# Patient Record
Sex: Female | Born: 1995 | Race: Black or African American | Hispanic: No | Marital: Single | Smoking: Never smoker
Health system: Southern US, Community
[De-identification: ages and names within clinical notes are randomized; demographics above are authoritative.]

## PROBLEM LIST (undated history)

## (undated) ENCOUNTER — Ambulatory Visit (HOSPITAL_COMMUNITY): Discharge status: Absent without leave | Payer: Self-pay

## (undated) DIAGNOSIS — E669 Obesity, unspecified: Secondary | ICD-10-CM

## (undated) DIAGNOSIS — N390 Urinary tract infection, site not specified: Secondary | ICD-10-CM

## (undated) DIAGNOSIS — B009 Herpesviral infection, unspecified: Secondary | ICD-10-CM

## (undated) HISTORY — DX: Obesity, unspecified: E66.9

## (undated) HISTORY — PX: NO PAST SURGERIES: SHX2092

## (undated) HISTORY — DX: Urinary tract infection, site not specified: N39.0

---
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
## 2016-01-16 ENCOUNTER — Encounter: Payer: Self-pay | Admitting: Emergency Medicine

## 2016-01-16 ENCOUNTER — Emergency Department
Admission: EM | Admit: 2016-01-16 | Discharge: 2016-01-16 | Disposition: A | Discharge status: Home / Self Care | Payer: Self-pay | Attending: Emergency Medicine | Admitting: Emergency Medicine

## 2016-01-16 DIAGNOSIS — N390 Urinary tract infection, site not specified: Secondary | ICD-10-CM | POA: Insufficient documentation

## 2016-01-16 DIAGNOSIS — N9089 Other specified noninflammatory disorders of vulva and perineum: Secondary | ICD-10-CM | POA: Insufficient documentation

## 2016-01-16 LAB — URINALYSIS COMPLETE WITH MICROSCOPIC (ARMC ONLY)
Bacteria, UA: NONE SEEN
Bilirubin Urine: NEGATIVE
Glucose, UA: NEGATIVE mg/dL
Hgb urine dipstick: NEGATIVE
Nitrite: NEGATIVE
PROTEIN: 30 mg/dL — AB
SPECIFIC GRAVITY, URINE: 1.024 (ref 1.005–1.030)
pH: 6 (ref 5.0–8.0)

## 2016-01-16 LAB — WET PREP, GENITAL
Clue Cells Wet Prep HPF POC: NONE SEEN
Sperm: NONE SEEN
TRICH WET PREP: NONE SEEN
YEAST WET PREP: NONE SEEN

## 2016-01-16 LAB — POCT PREGNANCY, URINE: Preg Test, Ur: NEGATIVE

## 2016-01-16 MED ORDER — SULFAMETHOXAZOLE-TRIMETHOPRIM 800-160 MG PO TABS: 1.0000 | ORAL_TABLET | Freq: Two times a day (BID) | ORAL | 0 refills | Status: AC

## 2016-01-16 NOTE — ED Notes (Signed)
Assessment per provider 

## 2016-01-16 NOTE — ED Provider Notes (Signed)
[facility] Emergency Department Provider Note  ____________________________________________  Time seen: Approximately 7:21 PM  I have reviewed the triage vital signs and the nursing notes.   HISTORY  Chief Complaint Other (vaginal pain/bumps)   HPI Margaret Sosa is a 20 y.o. female is here with complaint of vaginal discharge along with "bumps". Patient states she was seen by the doctor at the apartment who told her she had a urinary tract infection. Patient has had dysuria but noticed yesterday that she had "bumps" that were very painful. Patient denies any prior herpes infections. She states that currently she is sexually active and did have a STD workup at the [facility] Department which she was told was negative.She also complains of a vaginal discharge and she is also spotting with her menses at this time. Denies any fever or chills, there is been no nausea or vomiting, and no abdominal pain. Currently she rates her pain as 6/10.   History reviewed. No pertinent past medical history.  There are no active problems to display for this patient.   History reviewed. No pertinent surgical history.    Allergies Review of patient's allergies indicates no known allergies.  History reviewed. No pertinent family history.  Social History Social History  Substance Use Topics  . Smoking status: Never Smoker  . Smokeless tobacco: Never Used  . Alcohol use Not on file    Review of Systems Constitutional: No fever/chills Cardiovascular: Denies chest pain. Respiratory: Denies shortness of breath. Gastrointestinal: No abdominal pain.  No nausea, no vomiting.  Genitourinary: Positive for dysuria. Positive for discharge. Positive for painful lesions labia. Musculoskeletal: Negative for back pain. Skin: Positive for lesions. Neurological: Negative for headaches.  10-point ROS otherwise negative.  ____________________________________________   PHYSICAL  EXAM:  VITAL SIGNS: ED Triage Vitals  Enc Vitals Group     BP 01/16/16 1821 (!) 151/95     Pulse Rate 01/16/16 1821 98     Resp 01/16/16 1821 18     Temp 01/16/16 1821 98.5 F (36.9 C)     Temp src --      SpO2 01/16/16 1821 98 %     Weight 01/16/16 1821 209 lb (94.8 kg)     Height 01/16/16 1821  (1.626 m)     Head Circumference --      Peak Flow --      Pain Score 01/16/16 1822 6     Pain Loc --      Pain Edu? --      Excl. in GC? --     Constitutional: Alert and oriented. Well appearing and in no acute distress. Eyes: Conjunctivae are normal. PERRL. EOMI. Head: Atraumatic. Nose: No congestion/rhinnorhea. Neck: No stridor.   Cardiovascular: Normal rate, regular rhythm. Grossly normal heart sounds.  Good peripheral circulation. Respiratory: Normal respiratory effort.  No retractions. Lungs CTAB. Gastrointestinal: Soft and nontender. No distention.  Genitourinary: On examination of the labia there are multiple papules all of which have been uncapped and there is no vesicles present. There is little to no erythema present at the base. Areas are extremely tender to touch. Vaginal wall does not appear to be involved. There is some brownish colored discharge present. No adnexal masses or tenderness was noted. Negative cervical motion tenderness. Musculoskeletal: Moves upper and lower extremities without any difficulty. Normal gait was noted. Neurologic:  Normal speech and language. No gross focal neurologic deficits are appreciated. No gait instability. Skin:  Skin is warm, dry and intact.  Rash as noted above and genitourinary. Psychiatric: Mood and affect are normal. Speech and behavior are normal.  ____________________________________________   LABS (all labs ordered are listed, but only abnormal results are displayed)  Labs Reviewed  WET PREP, GENITAL - Abnormal; Notable for the following:       Result Value   WBC, Wet Prep HPF POC TOO NUMEROUS TO COUNT (*)    All  other components within normal limits  URINALYSIS COMPLETEWITH MICROSCOPIC ([facility] ONLY) - Abnormal; Notable for the following:    Color, Urine YELLOW (*)    APPearance CLEAR (*)    Ketones, ur 1+ (*)    Protein, ur 30 (*)    Leukocytes, UA 1+ (*)    Squamous Epithelial / LPF 0-5 (*)    All other components within normal limits  HSV CULTURE AND TYPING  POC URINE PREG, ED  POCT PREGNANCY, URINE    PROCEDURES  Procedure(s) performed: None  Procedures  Critical Care performed: No  ____________________________________________   INITIAL IMPRESSION / ASSESSMENT AND PLAN / ED COURSE  Pertinent labs & imaging results that were available during my care of the patient were reviewed by me and considered in my medical decision making (see chart for details).  Patient was able to show bilateral where she was only given Bactrim DS twice a day for 3 days for urinary tract infection from the health Department. Also wet prep and urine continued dissection showed WBCs. A viral culture was sent to the lab and patient is aware this probably take 72 hours for the results. At this time because the atypical presentation no anti- viral medication was started. ____________________________________________   FINAL CLINICAL IMPRESSION(S) / ED DIAGNOSES  Final diagnoses:  Acute urinary tract infection  Labial lesion      NEW MEDICATIONS STARTED DURING THIS VISIT:  New Prescriptions   SULFAMETHOXAZOLE-TRIMETHOPRIM (BACTRIM DS,SEPTRA DS) 800-160 MG TABLET    Take 1 tablet by mouth 2 (two) times daily.     Note:  This document was prepared using Dragon voice recognition software and may include unintentional dictation errors.    Tommi Rumps, PA-C 01/16/16 2202    Myrna Blazer, MD 01/17/16 [phone]

## 2016-01-16 NOTE — ED Triage Notes (Signed)
2 days ago started with dysuria. Bought strip at [facility] and said had UTI. Went to doctor and was given abx but the doctor did not evaluate her vaginal pain or "bumps down there". Patient reports only here for the vaginal pain/bumps. Has had vaginal discharge.

## 2016-01-16 NOTE — ED Notes (Signed)
HSV specimen collected by PA and sent by ED tech Allayne Stack.

## 2016-01-20 LAB — HSV CULTURE AND TYPING

## 2016-12-21 ENCOUNTER — Encounter: Payer: Self-pay | Admitting: Obstetrics and Gynecology

## 2016-12-21 ENCOUNTER — Ambulatory Visit (INDEPENDENT_AMBULATORY_CARE_PROVIDER_SITE_OTHER): Payer: Medicaid Other | Admitting: Obstetrics and Gynecology

## 2016-12-21 VITALS — BP 117/71 | HR 73 | Ht 64.0 in | Wt 214.4 lb

## 2016-12-21 DIAGNOSIS — E669 Obesity, unspecified: Secondary | ICD-10-CM

## 2016-12-21 DIAGNOSIS — Z833 Family history of diabetes mellitus: Secondary | ICD-10-CM

## 2016-12-21 DIAGNOSIS — B373 Candidiasis of vulva and vagina: Secondary | ICD-10-CM | POA: Diagnosis not present

## 2016-12-21 DIAGNOSIS — Z87440 Personal history of urinary (tract) infections: Secondary | ICD-10-CM

## 2016-12-21 DIAGNOSIS — B3731 Acute candidiasis of vulva and vagina: Secondary | ICD-10-CM

## 2016-12-21 HISTORY — DX: Obesity, unspecified: E66.9

## 2016-12-21 LAB — POCT URINALYSIS DIPSTICK
Bilirubin, UA: NEGATIVE
GLUCOSE UA: NEGATIVE
Ketones, UA: NEGATIVE
Leukocytes, UA: NEGATIVE
NITRITE UA: NEGATIVE
Protein, UA: NEGATIVE
RBC UA: NEGATIVE
Spec Grav, UA: 1.01 (ref 1.010–1.025)
UROBILINOGEN UA: 0.2 U/dL
pH, UA: 6.5 (ref 5.0–8.0)

## 2016-12-21 MED ORDER — NITROFURANTOIN MONOHYD MACRO 100 MG PO CAPS: 100.0000 mg | ORAL_CAPSULE | Freq: Every day | ORAL | 6 refills | Status: DC

## 2016-12-21 MED ORDER — FLUCONAZOLE 150 MG PO TABS: 150.0000 mg | ORAL_TABLET | ORAL | 0 refills | Status: DC

## 2016-12-21 NOTE — Progress Notes (Signed)
GYNECOLOGY CLINIC PROGRESS NOTE  Subjective:    Margaret Sosa is a 21 y.o. G0P0 female who complains of recurrent UTI's.   Notes that she has had 7 UTI's in the past year, 3 within the past 2 months. Not associated with timing of intercourse, and has good hygiene practices (uses vaginal wipes, changes clothes if excessive sweating, uses mild soaps).  Also notes recurrent yeast infections.  Notes that these tend to alternate with the UTI's. Is currently treating for a yeast infection, has been on medication x 3 days with Monistat.  Patient denies back pain and fever. Patient does not have a history of pyelonephritis.   OB History  Gravida Para Term Preterm AB Living  0 0 0 0 0 0  SAB TAB Ectopic Multiple Live Births  0 0 0 0 0         Past Medical History:  Diagnosis Date  . Obesity (BMI 35.0-39.9 without comorbidity) 12/21/2016  . Recurrent UTI (urinary tract infection)     Past Surgical History:  Procedure Laterality Date  . NO PAST SURGERIES      Family History  Problem Relation Age of Onset  . Hypertension Mother   . Diabetes Sister 4    Social History   Social History  . Marital status: Single    Spouse name: N/A  . Number of children: N/A  . Years of education: N/A   Occupational History  . Not on file.   Social History Main Topics  . Smoking status: Never Smoker  . Smokeless tobacco: Never Used  . Alcohol use No  . Drug use: Yes    Types: Marijuana  . Sexual activity: Yes    Birth control/ protection: Implant   Other Topics Concern  . Not on file   Social History Narrative  . No narrative on file    No current outpatient prescriptions on file prior to visit.   No current facility-administered medications on file prior to visit.     No Known Allergies    Review of Systems Pertinent items noted in HPI and remainder of comprehensive ROS otherwise negative.   Objective:    BP 117/71 (BP Location: Left Arm, Patient Position: Sitting,  Cuff Size: Large)   Pulse 73   Ht 5\' 4"  (1.626 m)   Wt 214 lb 6.4 oz (97.3 kg)   LMP 01/25/2016   BMI 36.80 kg/m  General: alert and no distress  Abdomen: soft, non-tender, without masses or organomegaly  Back: spine nontender, straight, CVA tenderness absent  GU: external genitalia normal, rectovaginal septum normal. Urethral meatus normal, no lesions.  Vagina with small amount of white clumpy discharge and white cream.  Cervix normal appearing, no lesions and no motion tenderness.  Uterus mobile, nontender, normal shape and size.  Adnexae non-palpable, nontender bilaterally.   Extremities: Non-tender,  no cyanosis or edema  Neurologic: Grossly intact    Laboratory:  Results for orders placed or performed in visit on 12/21/16  POCT urinalysis dipstick  Result Value Ref Range   Color, UA pale yellow    Clarity, UA clear    Glucose, UA neg    Bilirubin, UA neg    Ketones, UA neg    Spec Grav, UA 1.010 1.010 - 1.025   Blood, UA neg    pH, UA 6.5 5.0 - 8.0   Protein, UA neg    Urobilinogen, UA 0.2 0.2 or 1.0 E.U./dL   Nitrite, UA neg    Leukocytes, UA  Negative Negative     Assessment:   Recurrent vulvovaginitis  Recurrent UTI's  Family history of diabetes (childhood onset in sister) Obesity  Plan:    1. Medications: nitrofurantoin and Diflucan for supression therapy.  2. Maintain adequate hydration.  Can use cranberry supplements/juice.   3. Reiterated good hygiene techniques, including wiping/showering and voiding prior to and after intercourse, avoiding sodas/sugary foods.  4. Patient with family history of early-onset diabetes in sister.  Recommend screening for diabetes as patient is susceptible to multiple infections.  5. Discussion on weight management in patient, healthy lifestyle choices as patient is at risk for medical comorbidities at an earlier age with obesity.  6. Follow up if symptoms not improving, and in 6 months.     Hildred Laser, MD [facility]

## 2016-12-22 LAB — HEMOGLOBIN A1C
ESTIMATED AVERAGE GLUCOSE: 123 mg/dL
Hgb A1c MFr Bld: 5.9 % — ABNORMAL HIGH (ref 4.8–5.6)

## 2016-12-23 LAB — URINE CULTURE

## 2016-12-26 ENCOUNTER — Telehealth: Payer: Self-pay

## 2016-12-26 DIAGNOSIS — R7303 Prediabetes: Secondary | ICD-10-CM

## 2016-12-26 NOTE — Telephone Encounter (Signed)
Called pt informed her of the information below. Referral sent for [facility] at pt's request. Scheduled for lab visit in 6mos.

## 2016-12-26 NOTE — Telephone Encounter (Signed)
-----   Message from Hildred LaserAnika Cherry, MD sent at 12/26/2016 12:09 PM EDT ----- Sorry, I got her confused with my other patient (21 y.o. Pregnant) when I saw her picture in the demographics.  But she does not require a PCP at this time, she does however need to be aware that she is pre-diabetic.  She would benefit from some Lifestyles intervention (dietary modification and exercise), and can be referred to Lifestyles classes if desired.  She should probably have her levels checked again in 3-6 months after initiation of modifications.

## 2017-01-26 ENCOUNTER — Emergency Department: Payer: Medicaid Other

## 2017-01-26 ENCOUNTER — Encounter: Payer: Self-pay | Admitting: Emergency Medicine

## 2017-01-26 ENCOUNTER — Emergency Department
Admission: EM | Admit: 2017-01-26 | Discharge: 2017-01-26 | Disposition: A | Discharge status: Home / Self Care | Payer: Medicaid Other | Attending: Emergency Medicine | Admitting: Emergency Medicine

## 2017-01-26 DIAGNOSIS — Y92410 Unspecified street and highway as the place of occurrence of the external cause: Secondary | ICD-10-CM | POA: Diagnosis not present

## 2017-01-26 DIAGNOSIS — M79632 Pain in left forearm: Secondary | ICD-10-CM | POA: Insufficient documentation

## 2017-01-26 DIAGNOSIS — Z79899 Other long term (current) drug therapy: Secondary | ICD-10-CM | POA: Insufficient documentation

## 2017-01-26 DIAGNOSIS — Y939 Activity, unspecified: Secondary | ICD-10-CM | POA: Insufficient documentation

## 2017-01-26 DIAGNOSIS — Y999 Unspecified external cause status: Secondary | ICD-10-CM | POA: Diagnosis not present

## 2017-01-26 DIAGNOSIS — S92314A Nondisplaced fracture of first metatarsal bone, right foot, initial encounter for closed fracture: Secondary | ICD-10-CM | POA: Diagnosis not present

## 2017-01-26 DIAGNOSIS — S90921A Unspecified superficial injury of right foot, initial encounter: Secondary | ICD-10-CM | POA: Diagnosis present

## 2017-01-26 MED ORDER — HYDROCODONE-ACETAMINOPHEN 5-325 MG PO TABS
2.0000 | ORAL_TABLET | Freq: Once | ORAL | Status: AC
  Administered 2017-01-26: 2 via ORAL
  Filled 2017-01-26: qty 2

## 2017-01-26 MED ORDER — HYDROCODONE-ACETAMINOPHEN 5-325 MG PO TABS: 1.0000 | ORAL_TABLET | ORAL | 0 refills | Status: DC | PRN
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
## 2017-06-27 ENCOUNTER — Encounter: Payer: Medicaid Other | Admitting: Obstetrics and Gynecology

## 2021-04-28 ENCOUNTER — Other Ambulatory Visit: Payer: Self-pay

## 2021-04-28 ENCOUNTER — Encounter (HOSPITAL_COMMUNITY): Payer: Self-pay | Admitting: Obstetrics & Gynecology

## 2021-04-28 ENCOUNTER — Inpatient Hospital Stay (HOSPITAL_COMMUNITY)
Admission: AD | Admit: 2021-04-28 | Discharge: 2021-04-28 | Disposition: A | Discharge status: Home / Self Care | Payer: BC Managed Care – PPO | Attending: Obstetrics and Gynecology | Admitting: Obstetrics and Gynecology

## 2021-04-28 ENCOUNTER — Ambulatory Visit (HOSPITAL_COMMUNITY)
Admission: EM | Admit: 2021-04-28 | Discharge: 2021-04-28 | Disposition: A | Discharge status: Home / Self Care | Payer: BC Managed Care – PPO | Attending: Internal Medicine | Admitting: Internal Medicine

## 2021-04-28 DIAGNOSIS — Z3201 Encounter for pregnancy test, result positive: Secondary | ICD-10-CM

## 2021-04-28 DIAGNOSIS — O209 Hemorrhage in early pregnancy, unspecified: Secondary | ICD-10-CM | POA: Diagnosis present

## 2021-04-28 DIAGNOSIS — O200 Threatened abortion: Secondary | ICD-10-CM | POA: Diagnosis not present

## 2021-04-28 DIAGNOSIS — Z3A09 9 weeks gestation of pregnancy: Secondary | ICD-10-CM | POA: Insufficient documentation

## 2021-04-28 LAB — POC URINE PREG, ED: Preg Test, Ur: POSITIVE — AB

## 2021-04-28 LAB — HCG, QUANTITATIVE, PREGNANCY: hCG, Beta Chain, Quant, S: 5063 m[IU]/mL — ABNORMAL HIGH (ref <5)

## 2021-04-28 NOTE — MAU Note (Signed)
Pt stated she had some pink spotting and cramping on Sunday. Went OBGYN  and they did U/S told her was 5 weeks.  Has had more spotting and cramping yesterday and today.  Passed a few small blood clots.

## 2021-04-28 NOTE — MAU Provider Note (Signed)
History     Chief Complaint  Patient presents with   Abdominal Pain   Vaginal Bleeding   25 yo G1P0 SBF presents with c/o vaginal bleeding and cramping with passage of a clot. Pt had sonogram  2 days ago where a gestational sac was seen but not measurable w/r to gestational age. Pt notes she had been  Having vaginal bleeding from last sunday  OB History     Gravida  1   Para  0   Term  0   Preterm  0   AB  0   Living  0      SAB  0   IAB  0   Ectopic  0   Multiple  0   Live Births  0           Past Medical History:  Diagnosis Date   Obesity (BMI 35.0-39.9 without comorbidity) 12/21/2016   Recurrent UTI (urinary tract infection)     Past Surgical History:  Procedure Laterality Date   NO PAST SURGERIES      Family History  Problem Relation Age of Onset   Hypertension Mother    Diabetes Sister 4    Social History   Tobacco Use   Smoking status: Never   Smokeless tobacco: Never  Vaping Use   Vaping Use: Never used  Substance Use Topics   Alcohol use: Not Currently   Drug use: Not Currently    Types: Marijuana    Comment: not since pregnancy    Allergies: No Known Allergies  Medications Prior to Admission  Medication Sig Dispense Refill Last Dose   etonogestrel (NEXPLANON) 68 MG IMPL implant 1 each by Subdermal route once.      fluconazole (DIFLUCAN) 150 MG tablet Take 1 tablet (150 mg total) by mouth once a week. For 6 months 24 tablet 0    HYDROcodone-acetaminophen (NORCO/VICODIN) 5-325 MG tablet Take 1 tablet by mouth every 4 (four) hours as needed. 15 tablet 0    nitrofurantoin, macrocrystal-monohydrate, (MACROBID) 100 MG capsule Take 1 capsule (100 mg total) by mouth daily. 30 capsule 6      Physical Exam   Blood pressure 130/69, pulse 91, temperature 98.7 F (37.1 C), resp. rate 18, height 5\' 4"  (1.626 m), weight 101.2 kg, last menstrual period 03/01/2021.  General appearance: alert, cooperative, and no distress Lungs: clear  to auscultation bilaterally Heart: regular rate and rhythm, S1, S2 normal, no murmur, click, rub or gallop Abdomen:  soft obese nontender Pelvic: external genitalia normal and vagina scant blood in the vault. Cervix closed. Uterus sl enlarged non tender Adnexa no palp mass  IMP: threatened abortion P) hquant ED Course   MDM Addendum11/11/2020 Marland Kitchen Reviewed lab with pt  Threatened AB precautions reviewed  Keep scheduled f/u sonogram appt for viability D/c home  CHYIFO [id number], MD 12:03 PM 04/28/2021

## 2021-04-30 ENCOUNTER — Emergency Department (HOSPITAL_COMMUNITY)
Admission: EM | Admit: 2021-04-30 | Discharge: 2021-05-01 | Disposition: A | Discharge status: Home / Self Care | Payer: BC Managed Care – PPO | Attending: Emergency Medicine | Admitting: Emergency Medicine

## 2021-04-30 ENCOUNTER — Other Ambulatory Visit: Payer: Self-pay

## 2021-04-30 ENCOUNTER — Encounter (HOSPITAL_COMMUNITY): Payer: Self-pay | Admitting: Emergency Medicine

## 2021-04-30 ENCOUNTER — Emergency Department (HOSPITAL_COMMUNITY): Payer: BC Managed Care – PPO

## 2021-04-30 DIAGNOSIS — Z3A00 Weeks of gestation of pregnancy not specified: Secondary | ICD-10-CM | POA: Diagnosis not present

## 2021-04-30 DIAGNOSIS — N939 Abnormal uterine and vaginal bleeding, unspecified: Secondary | ICD-10-CM

## 2021-04-30 DIAGNOSIS — R102 Pelvic and perineal pain: Secondary | ICD-10-CM | POA: Insufficient documentation

## 2021-04-30 DIAGNOSIS — O209 Hemorrhage in early pregnancy, unspecified: Secondary | ICD-10-CM | POA: Diagnosis present

## 2021-04-30 HISTORY — DX: Herpesviral infection, unspecified: B00.9

## 2021-04-30 LAB — BASIC METABOLIC PANEL
Anion gap: 6 (ref 5–15)
BUN: 9 mg/dL (ref 6–20)
CO2: 24 mmol/L (ref 22–32)
Calcium: 9.3 mg/dL (ref 8.9–10.3)
Chloride: 106 mmol/L (ref 98–111)
Creatinine, Ser: 0.58 mg/dL (ref 0.44–1.00)
GFR, Estimated: >60 mL/min (ref >60)
Glucose, Bld: 100 mg/dL — ABNORMAL HIGH (ref 70–99)
Potassium: 4.1 mmol/L (ref 3.5–5.1)
Sodium: 136 mmol/L (ref 135–145)

## 2021-04-30 LAB — CBC
HCT: 38.2 % (ref 36.0–46.0)
Hemoglobin: 12.2 g/dL (ref 12.0–15.0)
MCH: 27.1 pg (ref 26.0–34.0)
MCHC: 31.9 g/dL (ref 30.0–36.0)
MCV: 84.9 fL (ref 80.0–100.0)
Platelets: 317 10*3/uL (ref 150–400)
RBC: 4.5 MIL/uL (ref 3.87–5.11)
RDW: 14.9 % (ref 11.5–15.5)
WBC: 8 10*3/uL (ref 4.0–10.5)
nRBC: 0 % (ref 0.0–0.2)

## 2021-04-30 LAB — I-STAT BETA HCG BLOOD, ED (MC, WL, AP ONLY): I-stat hCG, quantitative: 1159.9 m[IU]/mL — ABNORMAL HIGH (ref <5)

## 2021-04-30 NOTE — ED Provider Notes (Signed)
[facility]-EMERGENCY DEPT Provider Note   CSN: [id number] Arrival date & time: 04/30/21  2102     History Chief Complaint  Patient presents with   Miscarriage    Margaret Sosa is a 25 y.o. female with a hx of obesity & HSV2 who presents to the ED with complaints of vaginal bleeding x 5 days suspected to be about [redacted] weeks pregnant.  Patient states that she has been having some mild pelvic cramping and spotting since 04/26/2021, she saw her OB/GYN Dr. Cherly Hensen that day who did a pelvic exam and informed her that her cervix was closed at that time, plan was for continued monitoring.  2 days ago she started to have increasing cramping and increase in bleeding, she was again seen by her OB/GYN, hCG quant level looked okay at that time per patient report, plan was for continued monitoring.  Today she states that she passed a large blood clot and since then the cramping has mostly resolved, she still has some mild bleeding.  No specific alleviating or aggravating factors to her symptoms.  She denies fever, chills, vomiting, syncope, chest pain, or trouble breathing.  This is her first pregnancy.    HPI     Past Medical History:  Diagnosis Date   HSV-2 (herpes simplex virus 2) infection    Obesity (BMI 35.0-39.9 without comorbidity) 12/21/2016   Recurrent UTI (urinary tract infection)     Patient Active Problem List   Diagnosis Date Noted   Obesity (BMI 35.0-39.9 without comorbidity) 12/21/2016    Past Surgical History:  Procedure Laterality Date   NO PAST SURGERIES       OB History     Gravida  1   Para  0   Term  0   Preterm  0   AB  0   Living  0      SAB  0   IAB  0   Ectopic  0   Multiple  0   Live Births  0           Family History  Problem Relation Age of Onset   Hypertension Mother    Diabetes Sister 4    Social History   Tobacco Use   Smoking status: Never   Smokeless tobacco: Never  Vaping Use   Vaping Use: Never  used  Substance Use Topics   Alcohol use: Not Currently   Drug use: Not Currently    Types: Marijuana    Comment: not since pregnancy    Home Medications Prior to Admission medications   Not on File    Allergies    Patient has no known allergies.  Review of Systems   Review of Systems  Constitutional:  Negative for fever.  Respiratory:  Negative for shortness of breath.   Cardiovascular:  Negative for chest pain.  Gastrointestinal:  Negative for diarrhea and vomiting.  Genitourinary:  Positive for pelvic pain and vaginal bleeding. Negative for dysuria.  Neurological:  Negative for syncope.  All other systems reviewed and are negative.  Physical Exam Updated Vital Signs BP (!) 154/86 (BP Location: Right Arm)   Pulse 84   Temp 98.6 F (37 C) (Oral)   Resp 16   Ht 5\' 4"  (1.626 m)   Wt 101.2 kg   LMP 03/01/2021   SpO2 100%   BMI 38.28 kg/m   Physical Exam Vitals and nursing note reviewed.  Constitutional:      General: She is not in  acute distress.    Appearance: She is well-developed. She is not toxic-appearing.  HENT:     Head: Normocephalic and atraumatic.  Eyes:     General:        Right eye: No discharge.        Left eye: No discharge.     Conjunctiva/sclera: Conjunctivae normal.  Cardiovascular:     Rate and Rhythm: Normal rate and regular rhythm.  Pulmonary:     Effort: Pulmonary effort is normal. No respiratory distress.     Breath sounds: Normal breath sounds. No wheezing, rhonchi or rales.  Abdominal:     General: There is no distension.     Palpations: Abdomen is soft.     Tenderness: There is no abdominal tenderness. There is no guarding or rebound.  Genitourinary:    Comments: RN Shanda Bumps present as chaperone.  Mild amount of blood present in the vaginal canal, there is no significant hemorrhaging or significant clots noted. Musculoskeletal:     Cervical back: Neck supple.  Skin:    General: Skin is warm and dry.     Findings: No rash.   Neurological:     Mental Status: She is alert.     Comments: Clear speech.   Psychiatric:        Behavior: Behavior normal.    ED Results / Procedures / Treatments   Labs (all labs ordered are listed, but only abnormal results are displayed) Labs Reviewed  BASIC METABOLIC PANEL - Abnormal; Notable for the following components:      Result Value   Glucose, Bld 100 (*)    All other components within normal limits  I-STAT BETA HCG BLOOD, ED ([facility], [facility], [facility] ONLY) - Abnormal; Notable for the following components:   I-stat hCG, quantitative 1,159.9 (*)    All other components within normal limits  CBC  TYPE AND SCREEN    EKG None  Radiology US OB Comp < 14 Wks  Result Date: 05/01/2021 CLINICAL DATA:  History of vaginal bleeding with positive pregnancy test EXAM: OBSTETRIC <14 WK [place] AND TRANSVAGINAL OB US TECHNIQUE: Both transabdominal and transvaginal ultrasound examinations were performed for complete evaluation of the gestation as well as the maternal uterus, adnexal regions, and pelvic cul-de-sac. Transvaginal technique was performed to assess early pregnancy. COMPARISON:  None. FINDINGS: Intrauterine gestational sac: Absent Maternal uterus/adnexae: Uterus is well visualized with normal appearing endometrium. No evidence of intrauterine or extrauterine gestation is noted. Ovaries are well visualized. Corpus luteum cyst is noted within the right ovary. No acute abnormality is identified. IMPRESSION: No evidence of intrauterine or extra uterine gestation. Given the decreasing beta HCG level, it is possible that the recent vaginal bleeding representative spontaneous abortion. Clinical correlation is recommended. Electronically Signed   By: Alcide Clever M.D.   On: 05/01/2021 00:40   US OB Transvaginal  Result Date: 05/01/2021 CLINICAL DATA:  History of vaginal bleeding with positive pregnancy test EXAM: OBSTETRIC <14 WK [place] AND TRANSVAGINAL OB US TECHNIQUE: Both transabdominal and transvaginal  ultrasound examinations were performed for complete evaluation of the gestation as well as the maternal uterus, adnexal regions, and pelvic cul-de-sac. Transvaginal technique was performed to assess early pregnancy. COMPARISON:  None. FINDINGS: Intrauterine gestational sac: Absent Maternal uterus/adnexae: Uterus is well visualized with normal appearing endometrium. No evidence of intrauterine or extrauterine gestation is noted. Ovaries are well visualized. Corpus luteum cyst is noted within the right ovary. No acute abnormality is identified. IMPRESSION: No evidence of intrauterine or extra uterine gestation.  Given the decreasing beta HCG level, it is possible that the recent vaginal bleeding representative spontaneous abortion. Clinical correlation is recommended. Electronically Signed   By: Alcide Clever M.D.   On: 05/01/2021 00:40    Procedures Procedures   Medications Ordered in ED Medications - No data to display  ED Course  I have reviewed the triage vital signs and the nursing notes.  Pertinent labs & imaging results that were available during my care of the patient were reviewed by me and considered in my medical decision making (see chart for details).    MDM Rules/Calculators/A&P                           Patient presents to the ED with complaints of vaginal bleeding.. Nontoxic, vitals with elevated blood pressure, low suspicion for hypertensive emergency, normalized with repeat vitals.  On exam patient does have some mild blood present in her vaginal canal, there is no active hemorrhaging or significant clots present.  Difficult visualization of the cervix.  Plan for labs and ultrasound.  Additional history obtained:  Additional history obtained from chart review & nursing note review.  Patient had a quantitative hCG level on 04/28/2021 that was 5063  Lab Tests:  I Ordered, reviewed, and interpreted labs, which included:  CBC: Unremarkable BMP: Unremarkable I-STAT beta-hCG:  1159.9.  Imaging Studies ordered:  I ordered imaging studies which included [place], I independently reviewed, formal radiology impression shows:No evidence of intrauterine or extra uterine gestation. Given the decreasing beta HCG level, it is possible that the recent vaginal bleeding representative spontaneous abortion. Clinical correlation is recommended.   ED Course:  Patient with vaginal bleeding, downtrending hCG, no evidence of intrauterine or extrauterine gestation, therefore suspect spontaneous abortion is most likely.  Afebrile without leukocytosis, overall well-appearing, do not suspect septic abortion at this time.  Patient with a positive blood, no RhoGAM.  Overall appears appropriate for discharge home with close OB follow-up and strict return precautions. I discussed results, treatment plan, need for follow-up, and return precautions with the patient. Provided opportunity for questions, patient confirmed understanding and is in agreement with plan.   Findings and plan of care discussed with supervising physician Dr. Rodena Medin who is in agreement.   Portions of this note were generated with Scientist, clinical (histocompatibility and immunogenetics). Dictation errors may occur despite best attempts at proofreading.   Final Clinical Impression(s) / ED Diagnoses Final diagnoses:  Vaginal bleeding    Rx / DC Orders ED Discharge Orders     None        Cherly Anderson, PA-C 05/01/21 0130    Wynetta Fines, MD 05/01/21 2313

## 2021-04-30 NOTE — ED Provider Notes (Signed)
Emergency Medicine Provider Triage Evaluation Note  Margaret Sosa , a 25 y.o. female  was evaluated in triage.  She is a G1, P0 no history of previous pregnancy or miscarriage.  Pt complains of possible miscarriage.  Patient was approximately [redacted] weeks pregnant.  She saw her OB/GYN to have an ultrasound last Tuesday, but was told that it was too early.  She had been having some cramping and had a pelvic exam done where they did note some bleeding but her cervical os is closed.  Patient states she has been vaginally bleeding ever since then.  She says she passed a moderate amount of blood clot today.  She was having some suprapubic abdominal cramping, but that has since passed and she passed the blood clot.  She had a hCG drawn at her OB/GYN visit.  Review of Systems  Positive: Vaginal bleeding, pelvic pain Negative: Lightheadedness  Physical Exam  BP (!) 154/86 (BP Location: Right Arm)   Pulse 84   Temp 98.6 F (37 C) (Oral)   Resp 16   Ht 5\' 4"  (1.626 m)   Wt 101.2 kg   LMP 03/01/2021   SpO2 100%   BMI 38.28 kg/m  Gen:   Awake, no distress   Resp:  Normal effort  MSK:   Moves extremities without difficulty  Other:  Minimal abdominal tenderness to palpation of suprapubic region.  Medical Decision Making  Medically screening exam initiated at 10:13 PM.  Appropriate orders placed.  Margaret Sosa was informed that the remainder of the evaluation will be completed by another provider, this initial triage assessment does not replace that evaluation, and the importance of remaining in the ED until their evaluation is complete.  Reviewed labs and hCG has decreased about 4000 points since it was drawn 2 days ago.  Patient with continued vaginal bleeding, but pelvic pain has started to improve.  CBC with no anemia or leukocytosis.  Ordered type and screen.  This is patient's first pregnancy   Margaret Sosa 04/30/21 2216    2217, MD 05/01/21 [phone]

## 2021-04-30 NOTE — ED Notes (Signed)
Patient transported to Ultrasound 

## 2021-04-30 NOTE — ED Triage Notes (Signed)
Patient arrives complaining of possible miscarriage. Patient seen for cramping in ER and followed up with OB who stated that if cramping worsening go to ER. Over the last few days, pain has increased. Patient states when she woke up and went to the bathroom, she passed a moderate amount of tissue and a clot.

## 2021-05-01 LAB — ABO/RH: ABO/RH(D): A POS

## 2021-05-01 NOTE — Discharge Instructions (Signed)
You were seen in the emergency department today for vaginal bleeding.  Your ultrasound did not show any intrauterine pregnancy and your hormone levels are decreasing raise concern that you are having a miscarriage.  We would like you to follow-up very closely with your OB on Monday for this.  Return to the emergency department immediately for new or worsening symptoms including but not limited to new or worsening pain, increased bleeding, significant bleeding, dizziness, lightheadedness, passing out, inability to keep fluids down, fever, chills, or any other concerns.

## 2022-04-07 ENCOUNTER — Ambulatory Visit (HOSPITAL_COMMUNITY)
Admission: EM | Admit: 2022-04-07 | Discharge: 2022-04-07 | Disposition: A | Discharge status: Home / Self Care | Payer: BC Managed Care – PPO | Attending: Emergency Medicine | Admitting: Emergency Medicine

## 2022-04-07 ENCOUNTER — Encounter (HOSPITAL_COMMUNITY): Payer: Self-pay | Admitting: Emergency Medicine

## 2022-04-07 ENCOUNTER — Ambulatory Visit: Payer: Self-pay

## 2022-04-07 DIAGNOSIS — Z3201 Encounter for pregnancy test, result positive: Secondary | ICD-10-CM | POA: Diagnosis not present

## 2022-04-07 DIAGNOSIS — Z3491 Encounter for supervision of normal pregnancy, unspecified, first trimester: Secondary | ICD-10-CM | POA: Insufficient documentation

## 2022-04-07 DIAGNOSIS — Z113 Encounter for screening for infections with a predominantly sexual mode of transmission: Secondary | ICD-10-CM | POA: Diagnosis not present

## 2022-04-07 DIAGNOSIS — O23591 Infection of other part of genital tract in pregnancy, first trimester: Secondary | ICD-10-CM | POA: Insufficient documentation

## 2022-04-07 DIAGNOSIS — B3731 Acute candidiasis of vulva and vagina: Secondary | ICD-10-CM | POA: Diagnosis not present

## 2022-04-07 LAB — POCT URINALYSIS DIPSTICK, ED / UC
Bilirubin Urine: NEGATIVE
Glucose, UA: NEGATIVE mg/dL
Hgb urine dipstick: NEGATIVE
Ketones, ur: NEGATIVE mg/dL
Nitrite: NEGATIVE
Protein, ur: NEGATIVE mg/dL
Specific Gravity, Urine: 1.025 (ref 1.005–1.030)
Urobilinogen, UA: 0.2 mg/dL (ref 0.0–1.0)
pH: 6 (ref 5.0–8.0)

## 2022-04-07 LAB — POC URINE PREG, ED: Preg Test, Ur: POSITIVE — AB

## 2022-04-07 MED ORDER — MICONAZOLE NITRATE 2 % VA CREA: 1.0000 | TOPICAL_CREAM | Freq: Every day | VAGINAL | 0 refills | Status: AC

## 2022-04-07 MED ORDER — PRENATAL COMPLETE 14-0.4 MG PO TABS: 1.0000 | ORAL_TABLET | Freq: Every day | ORAL | 0 refills | Status: DC

## 2022-04-07 MED ORDER — MICONAZOLE NITRATE 2 % EX CREA: 1.0000 | TOPICAL_CREAM | Freq: Two times a day (BID) | CUTANEOUS | 0 refills | Status: DC

## 2022-04-07 NOTE — ED Provider Notes (Signed)
HPI  SUBJECTIVE:  Margaret Sosa is a G2, P0 26 y.o. female who presents with 3 days of slightly odorous, chunky white vaginal discharge, vaginal soreness, itching, irritation, dysuria, cloudy urine.  She reports occasional slight irregular abdominal cramping.  No other abdominal, pelvic, back pain, no vaginal bleeding or other urinary complaints.  She is in a long-term monogamous relationship with a female, who is asymptomatic, STDs are not a concern today.  No antibiotics recently.  No genital rash/blisters.  She tried bathing with improvement in her symptoms.  No aggravating factors.  She has a past medical history of HSV-2, BV and yeast.  She is status post miscarriage last November.  No history of other STDs, diabetes.  LMP: 9/2.  She had a positive home pregnancy test on Sunday.  PCP/OB/GYN: None.  Past Medical History:  Diagnosis Date   HSV-2 (herpes simplex virus 2) infection    Obesity (BMI 35.0-39.9 without comorbidity) 12/21/2016   Recurrent UTI (urinary tract infection)     Past Surgical History:  Procedure Laterality Date   NO PAST SURGERIES      Family History  Problem Relation Age of Onset   Hypertension Mother    Diabetes Sister 4    Social History   Tobacco Use   Smoking status: Never   Smokeless tobacco: Never  Vaping Use   Vaping Use: Never used  Substance Use Topics   Alcohol use: Not Currently   Drug use: Not Currently    Types: Marijuana    Comment: not since pregnancy    No current facility-administered medications for this encounter.  Current Outpatient Medications:    miconazole (MICONAZOLE 7) 2 % vaginal cream, Place 1 Applicatorful vaginally at bedtime for 7 days., Disp: 45 g, Rfl: 0   miconazole (MICOTIN) 2 % cream, Apply 1 Application topically 2 (two) times daily., Disp: 28.35 g, Rfl: 0   Prenatal Vit-Fe Fumarate-FA (PRENATAL COMPLETE) 14-0.4 MG TABS, Take 1 tablet by mouth daily., Disp: 30 tablet, Rfl: 0  No Known Allergies   ROS  As  noted in HPI.   Physical Exam  BP (!) 144/84 (BP Location: Right Arm)   Pulse 82   Temp 98.4 F (36.9 C) (Oral)   Resp 16   SpO2 97%   Constitutional: Well developed, well nourished, no acute distress Eyes:  EOMI, conjunctiva normal bilaterally HENT: Normocephalic, atraumatic,mucus membranes moist Respiratory: Normal inspiratory effort Cardiovascular: Normal rate GI: nondistended soft, nontender. No suprapubic, flank tenderness  back: No CVA tenderness GU: Deferred skin: No rash, skin intact Musculoskeletal: no deformities Neurologic: Alert & oriented x 3, no focal neuro deficits Psychiatric: Speech and behavior appropriate   ED Course   Medications - No data to display  Orders Placed This Encounter  Procedures   Urine Culture    Standing Status:   Standing    Number of Occurrences:   1    Order Specific Question:   Indication    Answer:   Bacteriuria screening (OB/GYN or Uro)   Ambulatory referral to Obstetrics / Gynecology    Referral Priority:   Urgent    Referral Type:   Consultation    Referral Reason:   Specialty Services Required    Requested Specialty:   Obstetrics and Gynecology    Number of Visits Requested:   1   POC Urinalysis dipstick    Standing Status:   Standing    Number of Occurrences:   1   POC urine pregnancy    Standing  Status:   Standing    Number of Occurrences:   1    Results for orders placed or performed during the hospital encounter of 04/07/22 (from the past 24 hour(s))  POC Urinalysis dipstick     Status: Abnormal   Collection Time: 04/07/22  5:06 PM  Result Value Ref Range   Glucose, UA NEGATIVE NEGATIVE mg/dL   Bilirubin Urine NEGATIVE NEGATIVE   Ketones, ur NEGATIVE NEGATIVE mg/dL   Specific Gravity, Urine 1.025 1.005 - 1.030   Hgb urine dipstick NEGATIVE NEGATIVE   pH 6.0 5.0 - 8.0   Protein, ur NEGATIVE NEGATIVE mg/dL   Urobilinogen, UA 0.2 0.0 - 1.0 mg/dL   Nitrite NEGATIVE NEGATIVE   Leukocytes,Ua SMALL (A) NEGATIVE   POC urine pregnancy     Status: Abnormal   Collection Time: 04/07/22  5:08 PM  Result Value Ref Range   Preg Test, Ur POSITIVE (A) NEGATIVE   No results found.  ED Clinical Impression  1. Yeast vaginitis   2. First trimester pregnancy   3. Positive pregnancy test   4. Screening examination for STD (sexually transmitted disease)      ED Assessment/Plan    Check UA, urine pregnancy to confirm pregnancy.  Sent off swab for gonorrhea, chlamydia, trichomonas, BV and yeast.   Urine pregnancy positive.  She has small leukocytes in her urine, but will send this off for culture prior to initiating antibiotic treatment.  H&P most c/w yeast infection.  Will not treat empirically for STIs now.  Sending home with miconazole 5 g per vagina nightly for 7 days, external miconazole and prenatal vitamins.  Diflucan is to be avoided in pregnancy, especially in the first trimester because it increases the risk of miscarriage per up-to-date.  Advised pt to refrain from sexual contact until she knows lab results, symptoms resolve, and partner is treated if necessary. Pt provided working phone number.  Will urgently refer to OB/GYN for ongoing care.  Discussed labs, MDM, plan and followup with patient.  ER return precautions given.  Pt agrees with plan.   Meds ordered this encounter  Medications   miconazole (MICONAZOLE 7) 2 % vaginal cream    Sig: Place 1 Applicatorful vaginally at bedtime for 7 days.    Dispense:  45 g    Refill:  0   Prenatal Vit-Fe Fumarate-FA (PRENATAL COMPLETE) 14-0.4 MG TABS    Sig: Take 1 tablet by mouth daily.    Dispense:  30 tablet    Refill:  0   miconazole (MICOTIN) 2 % cream    Sig: Apply 1 Application topically 2 (two) times daily.    Dispense:  28.35 g    Refill:  0    *This clinic note was created using Scientist, clinical (histocompatibility and immunogenetics). Therefore, there may be occasional mistakes despite careful proofreading.  ?     Domenick Gong, MD 04/07/22 1721

## 2022-04-07 NOTE — Discharge Instructions (Addendum)
Urine pregnancy is positive.  I have sent your urine off for culture to make sure that you do not have a urinary tract infection.  Finish the vaginal miconazole, even if you feel better.  Unfortunately, Diflucan is to be avoided in pregnancy, especially in the first trimester because it may increase the risk of miscarriage.  You can use the miconazole cream externally to help with the irritation.  We will contact you if we need to make any changes.

## 2022-04-07 NOTE — ED Triage Notes (Signed)
Pt reports white vaginal discharge x 3 days. States she recently found out she was pregnant with a home test on Sunday.

## 2022-04-09 LAB — URINE CULTURE: Culture: >=100000 — AB

## 2022-04-10 LAB — CERVICOVAGINAL ANCILLARY ONLY
Bacterial Vaginitis (gardnerella): POSITIVE — AB
Candida Glabrata: NEGATIVE
Candida Vaginitis: POSITIVE — AB
Chlamydia: NEGATIVE
Comment: NEGATIVE
Comment: NEGATIVE
Comment: NEGATIVE
Comment: NEGATIVE
Comment: NEGATIVE
Comment: NORMAL
Neisseria Gonorrhea: NEGATIVE
Trichomonas: NEGATIVE

## 2022-04-12 ENCOUNTER — Telehealth (HOSPITAL_COMMUNITY): Payer: Self-pay | Admitting: Emergency Medicine

## 2022-04-12 MED ORDER — METRONIDAZOLE 0.75 % VA GEL: 1.0000 | Freq: Every day | VAGINAL | 0 refills | Status: AC

## 2022-05-02 ENCOUNTER — Telehealth (INDEPENDENT_AMBULATORY_CARE_PROVIDER_SITE_OTHER): Payer: BC Managed Care – PPO

## 2022-05-02 DIAGNOSIS — R110 Nausea: Secondary | ICD-10-CM

## 2022-05-02 DIAGNOSIS — B9689 Other specified bacterial agents as the cause of diseases classified elsewhere: Secondary | ICD-10-CM

## 2022-05-02 DIAGNOSIS — Z3480 Encounter for supervision of other normal pregnancy, unspecified trimester: Secondary | ICD-10-CM | POA: Insufficient documentation

## 2022-05-02 DIAGNOSIS — Z3689 Encounter for other specified antenatal screening: Secondary | ICD-10-CM

## 2022-05-02 MED ORDER — VITAMIN B-6 100 MG PO TABS: 100.0000 mg | ORAL_TABLET | Freq: Two times a day (BID) | ORAL | 3 refills | Status: DC

## 2022-05-02 MED ORDER — METRONIDAZOLE 1 % EX GEL: Freq: Every day | CUTANEOUS | 0 refills | Status: DC

## 2022-05-02 MED ORDER — UNISOM SLEEPTABS 25 MG PO TABS: 25.0000 mg | ORAL_TABLET | Freq: Every evening | ORAL | 0 refills | Status: DC | PRN

## 2022-05-02 NOTE — Patient Instructions (Signed)

## 2022-05-02 NOTE — Progress Notes (Signed)
New OB Intake  I connected withNAME@ on 05/02/22 at  3:15 PM EST by MyChart Video Visit and verified that I am speaking with the correct person using two identifiers. Nurse is located at [facility] and pt is located at [facility].  I discussed the limitations, risks, security and privacy concerns of performing an evaluation and management service by telephone and the availability of in person appointments. I also discussed with the patient that there may be a patient responsible charge related to this service. The patient expressed understanding and agreed to proceed.  I explained I am completing New OB Intake today. We discussed EDD of 12/02/22 that is based on LMP of 02/25/22. Pt is G2/P0. I reviewed her allergies, medications, Medical/Surgical/OB history, and appropriate screenings. I informed her of [facility] services. [facility] information placed in AVS. Based on history, this is a low risk pregnancy.  Patient Active Problem List   Diagnosis Date Noted   Obesity (BMI 35.0-39.9 without comorbidity) 12/21/2016    Concerns addressed today  Delivery Plans Plans to deliver at [facility]. Patient given information for [facility] Healthy Baby website for more information about Women[facility] and [place]. Patient is not interested in water birth. Offered upcoming OB visit with CNM to discuss further.  MyChart/Babyscripts MyChart access verified. I explained pt will have some visits in office and some virtually. Babyscripts instructions given and order placed. Patient verifies receipt of registration text/e-mail. Account successfully created and app downloaded.  Blood Pressure Cuff/Weight Scale Patient has private insurance; instructed to purchase blood pressure cuff and bring to first prenatal appt. Explained after first prenatal appt pt will check weekly and document in 32. Patient does have weight scale.  Anatomy US Explained first scheduled [place] will be around 19 weeks. Anatomy US scheduled for 07/10/22 at  09:45. Pt notified to arrive at 09:30.  Labs Discussed Johnsie Cancel genetic screening with patient. Would like both Panorama and Horizon drawn at new OB visit. Routine prenatal labs needed.  COVID Vaccine Patient has not had COVID vaccine.   Is patient a CenteringPregnancy candidate?  Accepted Declined due to  NA Not a candidate due to  NA If accepted,    Is patient a Mom+Baby Combined Care candidate?  NA    If accepted, Mom+Baby staff notified  Social Determinants of Health Food Insecurity: Patient denies food insecurity. WIC Referral: Patient is interested in referral to [facility].  Transportation: Patient denies transportation needs. Childcare: Discussed no children allowed at ultrasound appointments. Offered childcare services; patient declines childcare services at this time.  First visit review I reviewed new OB appt with patient. I explained they will have a provider visit that includes . Explained pt will be seen by Manya Silvas, CNM at first visit; encounter routed to appropriate provider. Explained that patient will be seen by pregnancy navigator following visit with provider.   Bethanne Ginger, CMA 05/02/2022  3:15 PM

## 2022-05-16 ENCOUNTER — Ambulatory Visit (INDEPENDENT_AMBULATORY_CARE_PROVIDER_SITE_OTHER): Payer: BC Managed Care – PPO | Admitting: Advanced Practice Midwife

## 2022-05-16 VITALS — BP 150/94 | HR 114 | Wt 214.0 lb

## 2022-05-16 DIAGNOSIS — F4321 Adjustment disorder with depressed mood: Secondary | ICD-10-CM

## 2022-05-16 DIAGNOSIS — O3680X0 Pregnancy with inconclusive fetal viability, not applicable or unspecified: Secondary | ICD-10-CM | POA: Diagnosis not present

## 2022-05-16 DIAGNOSIS — Z3480 Encounter for supervision of other normal pregnancy, unspecified trimester: Secondary | ICD-10-CM

## 2022-05-16 DIAGNOSIS — B009 Herpesviral infection, unspecified: Secondary | ICD-10-CM | POA: Diagnosis not present

## 2022-05-16 NOTE — Progress Notes (Signed)
Ultrasounds Results Note  SUBJECTIVE HPI:  [name] is a 26 y.o. G2P0010 at [redacted]w[redacted]d by LMP who presents to [facility] for New OB and FHR could not be obtailed by doppler. Informal BS US performed.  Pt informed that the ultrasound is considered a limited OB ultrasound and is not intended to be a complete ultrasound exam.  Patient also informed that the ultrasound is not being completed with the intent of assessing for fetal or placental anomalies or any pelvic abnormalities.  Explained that the purpose of today's ultrasound is to assess for  viability.  Patient acknowledges the purpose of the exam and the limitations of the study.  The patient denies abdominal pain or vaginal bleeding.  Upon review of the patient's records, patient has not had any ultrasounds or HCGs. Pos UPT 04/02/22  Previous HCG's NOne  Previous US None   A positive  Informal Ultrasound was performed earlier today.   Past Medical History:  Diagnosis Date   HSV-2 (herpes simplex virus 2) infection    Obesity (BMI 35.0-39.9 without comorbidity) 12/21/2016   Recurrent UTI (urinary tract infection)    Past Surgical History:  Procedure Laterality Date   NO PAST SURGERIES     Social History   Socioeconomic History   Marital status: Single    Spouse name: Not on file   Number of children: Not on file   Years of education: Not on file   Highest education level: Not on file  Occupational History   Not on file  Tobacco Use   Smoking status: Never   Smokeless tobacco: Never  Vaping Use   Vaping Use: Never used  Substance and Sexual Activity   Alcohol use: Not Currently   Drug use: Not Currently    Types: Marijuana    Comment: not since pregnancy   Sexual activity: Yes    Birth control/protection: None  Other Topics Concern   Not on file  Social History Narrative   Not on file   Social Determinants of Health   Financial Resource Strain: Not on file  Food Insecurity: Not on file   Transportation Needs: Not on file  Physical Activity: Not on file  Stress: Not on file  Social Connections: Not on file  Intimate Partner Violence: Not on file   Current Outpatient Medications on File Prior to Visit  Medication Sig Dispense Refill   metroNIDAZOLE (METROGEL) 1 % gel Apply topically daily. 45 g 0   doxylamine, Sleep, (UNISOM SLEEPTABS) 25 MG tablet Take 1 tablet (25 mg total) by mouth at bedtime as needed. (Patient not taking: Reported on 05/16/2022) 30 tablet 0   miconazole (MICOTIN) 2 % cream Apply 1 Application topically 2 (two) times daily. (Patient not taking: Reported on 05/16/2022) 28.35 g 0   pyridOXINE (VITAMIN B6) 100 MG tablet Take 1 tablet (100 mg total) by mouth 2 (two) times daily. (Patient not taking: Reported on 05/16/2022) 30 tablet 3   No current facility-administered medications on file prior to visit.   No Known Allergies  I have reviewed patient's Past Medical Hx, Surgical Hx, Family Hx, Social Hx, medications and allergies.   Review of Systems  Gastrointestinal:  Negative for abdominal pain.  Genitourinary:  Negative for vaginal bleeding.   Review of Systems  Constitutional: Negative for fever and chills.  Gastrointestinal: Negative for abdominal pain.  Genitourinary: Negative for vaginal bleeding.  Musculoskeletal: Negative for back pain.  Neurological: Negative for dizziness and weakness.    Physical  Exam  BP (!) 150/94   Pulse (!) 114   Wt 214 lb (97.1 kg)   LMP 02/25/2022   BMI 36.73 kg/m   Patient's last menstrual period was 02/25/2022. GENERAL: Well-developed, well-nourished female in no acute distress. Tearful.  HEENT: Normocephalic, atraumatic.   LUNGS: Effort normal ABDOMEN: Deferred HEART: Mild tachycardia  SKIN: Warm, dry and without erythema PSYCH: Normal mood and affect NEURO: Alert and oriented x 4  LAB RESULTS No results found for this or any previous visit (from the past 24 hour(s)).  IMAGING 24 mm empty  Gestational sac. No Yolk sac or fetal pole.   ASSESSMENT 1. Pregnancy with inconclusive fetal viability, single or unspecified fetus   Suspect blighted ovum  PLAN Explained that [place] does not show baby which we would expect to see at 11 weeks. Formal US ordered. Next available is 05/22/22.  Go to MAU as needed for heavy bleeding, abdominal pain or fever greater than 100.4.  Colman, CNM 05/16/2022 5:01 PM

## 2022-05-21 DIAGNOSIS — B009 Herpesviral infection, unspecified: Secondary | ICD-10-CM | POA: Insufficient documentation

## 2022-05-22 ENCOUNTER — Ambulatory Visit (HOSPITAL_COMMUNITY)
Admission: RE | Admit: 2022-05-22 | Discharge: 2022-05-22 | Disposition: A | Discharge status: Home / Self Care | Payer: BC Managed Care – PPO | Source: Ambulatory Visit | Attending: Advanced Practice Midwife | Admitting: Advanced Practice Midwife

## 2022-05-22 DIAGNOSIS — O3680X0 Pregnancy with inconclusive fetal viability, not applicable or unspecified: Secondary | ICD-10-CM | POA: Insufficient documentation

## 2022-05-23 ENCOUNTER — Telehealth: Payer: Self-pay | Admitting: Advanced Practice Midwife

## 2022-05-23 ENCOUNTER — Encounter: Payer: Self-pay | Admitting: Advanced Practice Midwife

## 2022-05-23 DIAGNOSIS — O039 Complete or unspecified spontaneous abortion without complication: Secondary | ICD-10-CM

## 2022-05-23 NOTE — Addendum Note (Signed)
Addended by: Dorathy Kinsman on: 05/23/2022 02:05 PM   Modules accepted: Orders

## 2022-05-23 NOTE — Telephone Encounter (Signed)
Called pt to give [place] results from 05/22/22 showing no GS, FP or YS. Informal [place] 05/16/22 showed 24 mm empty GA. Pt reports bleeding and passing tissue over the last week. No heavy bleeding. Hx and Sx C/W miscarriage but IUP never confirmed. She and partner are coping well. Discussed Hx, [place] w/ Dr. Donavan Foil. Recommends weekly hGCs until <5 since IUP was never verified. Pt is agreeable, verbalizes understanding. Will schedule SAB F/U and IBH visit as will. Support given. SAB and ectopic precautions reviewed.  Pt wants to see someone regarding 2 SABs. Contact info for Dx. April Manson given.

## 2022-05-24 ENCOUNTER — Other Ambulatory Visit: Payer: Self-pay

## 2022-05-24 ENCOUNTER — Other Ambulatory Visit: Payer: BC Managed Care – PPO

## 2022-05-24 DIAGNOSIS — O039 Complete or unspecified spontaneous abortion without complication: Secondary | ICD-10-CM

## 2022-05-24 DIAGNOSIS — O3680X0 Pregnancy with inconclusive fetal viability, not applicable or unspecified: Secondary | ICD-10-CM

## 2022-05-25 LAB — BETA HCG QUANT (REF LAB): hCG Quant: 253 m[IU]/mL

## 2022-05-31 ENCOUNTER — Other Ambulatory Visit: Payer: BC Managed Care – PPO

## 2022-05-31 ENCOUNTER — Other Ambulatory Visit: Payer: Self-pay

## 2022-05-31 DIAGNOSIS — O039 Complete or unspecified spontaneous abortion without complication: Secondary | ICD-10-CM

## 2022-06-01 LAB — BETA HCG QUANT (REF LAB): hCG Quant: 18 m[IU]/mL

## 2022-06-05 NOTE — BH Specialist Note (Unsigned)
Integrated Behavioral Health via Telemedicine Visit  06/05/2022 Margaret Sosa [id number]  Number of Integrated Behavioral Health Clinician visits: No data recorded Session Start time: No data recorded  Session End time: No data recorded Total time in minutes: No data recorded  Referring Provider: *** Patient/Family location: *** [facility] Provider location: *** All persons participating in visit: *** Types of Service: {CHL AMB TYPE OF SERVICE:[phone]}  I connected with Margaret Sosa and/or Margaret Sosa's {family members:20773} via  Telephone or Engineer, civil (consulting)  (Video is Caregility application) and verified that I am speaking with the correct person using two identifiers. Discussed confidentiality: {YES/NO:21197}  I discussed the limitations of telemedicine and the availability of in person appointments.  Discussed there is a possibility of technology failure and discussed alternative modes of communication if that failure occurs.  I discussed that engaging in this telemedicine visit, they consent to the provision of behavioral healthcare and the services will be billed under their insurance.  Patient and/or legal guardian expressed understanding and consented to Telemedicine visit: {YES/NO:21197}  Presenting Concerns: Patient and/or family reports the following symptoms/concerns: *** Duration of problem: ***; Severity of problem: {Mild/Moderate/Severe:20260}  Patient and/or Family's Strengths/Protective Factors: {CHL AMB BH PROTECTIVE FACTORS:[phone]}  Goals Addressed: Patient will:  Reduce symptoms of: {IBH Symptoms:21014056}   Increase knowledge and/or ability of: {IBH Patient Tools:21014057}   Demonstrate ability to: {IBH Goals:21014053}  Progress towards Goals: {CHL AMB BH PROGRESS TOWARDS [phone]}  Interventions: Interventions utilized:  {IBH Interventions:21014054} Standardized Assessments completed: {IBH Screening  Tools:21014051}  Patient and/or Family Response: ***  Assessment: Patient currently experiencing ***.   Patient may benefit from ***.  Plan: Follow up with behavioral health clinician on : *** Behavioral recommendations: *** Referral(s): {IBH Referrals:21014055}  I discussed the assessment and treatment plan with the patient and/or parent/guardian. They were provided an opportunity to ask questions and all were answered. They agreed with the plan and demonstrated an understanding of the instructions.   They were advised to call back or seek an in-person evaluation if the symptoms worsen or if the condition fails to improve as anticipated.  Valetta Close Deborh Pense, LCSW

## 2022-06-07 ENCOUNTER — Ambulatory Visit: Payer: Self-pay | Admitting: Certified Nurse Midwife

## 2022-06-08 ENCOUNTER — Ambulatory Visit: Payer: Medicaid Other | Admitting: Clinical

## 2022-06-08 DIAGNOSIS — F4322 Adjustment disorder with anxiety: Secondary | ICD-10-CM

## 2022-06-08 DIAGNOSIS — F4321 Adjustment disorder with depressed mood: Secondary | ICD-10-CM

## 2022-06-08 NOTE — Patient Instructions (Signed)
[facility] [address] [place], [place] [address] [phone] (main office) [phone] (Syris Brookens's office)  Pregnancy Loss Support Groups [email or web address] 

## 2022-06-08 NOTE — BH Specialist Note (Signed)
Integrated Behavioral Health via Telemedicine Visit  06/08/2022 Margaret Sosa [id number]  Number of Integrated Behavioral Health Clinician visits: 1- Initial Visit  Session Start time: 1516   Session End time: 1551  Total time in minutes: 35   Referring Provider: Dorathy Kinsman, CNM Patient/Family location: [facility] Provider location: [facility] [employer] [facility]  All persons participating in visit: Patient Margaret Sosa and [name] Ankith Edmonston   Types of Service: Individual psychotherapy and Telephone visit  I connected with [name] and/or [name]'s  n/a  via  Telephone or Video Enabled Telemedicine Application  (Video is Caregility application) and verified that I am speaking with the correct person using two identifiers. Discussed confidentiality: Yes   I discussed the limitations of telemedicine and the availability of in person appointments.  Discussed there is a possibility of technology failure and discussed alternative modes of communication if that failure occurs.  I discussed that engaging in this telemedicine visit, they consent to the provision of behavioral healthcare and the services will be billed under their insurance.  Patient and/or legal guardian expressed understanding and consented to Telemedicine visit: Yes   Presenting Concerns: Patient and/or family reports the following symptoms/concerns: Grieving loss of pregnancy and increasing anxiety Duration: About three weeks  Patient and/or Family's Strengths/Protective Factors: Concrete supports in place (healthy food, safe environments, etc.) and Sense of purpose  Goals Addressed: Patient will:  Reduce symptoms of: anxiety and depression   Increase knowledge and/or ability of: healthy habits   Demonstrate ability to: Begin healthy grieving over loss  Progress towards Goals: Ongoing  Interventions: Interventions utilized:  Supportive Counseling  and Link to [employer] Standardized Assessments completed: GAD-7 and PHQ 9  Patient and/or Family Response: Patient agrees with treatment plan.   Assessment: Patient currently experiencing Grief and Adjustment disorder with anxiety.   Patient may benefit from psychoeducation and brief therapeutic interventions regarding coping with symptoms of anxiety and grieving loss .  Plan: Follow up with behavioral health clinician on : Two weeks Behavioral recommendations:  -Continue prioritizing healthy self-care (regular meals, adequate rest; allowing practical help from supportive friends and family) until at least postpartum medical appointment -Consider pregnancy loss support group as needed at either [email or web address] or [email or web address]   Referral(s): Integrated Art gallery manager (In Clinic) and [employer]:  pregnancy loss support group  I discussed the assessment and treatment plan with the patient and/or parent/guardian. They were provided an opportunity to ask questions and all were answered. They agreed with the plan and demonstrated an understanding of the instructions.   They were advised to call back or seek an in-person evaluation if the symptoms worsen or if the condition fails to improve as anticipated.  Rae Lips, LCSW     06/08/2022    3:21 PM  Depression screen PHQ 2/9  Decreased Interest 2  Down, Depressed, Hopeless 1  PHQ - 2 Score 3  Altered sleeping 3  Tired, decreased energy 0  Change in appetite 0  Feeling bad or failure about yourself  2  Trouble concentrating 1  Moving slowly or fidgety/restless 0  Suicidal thoughts 0  PHQ-9 Score 9      06/08/2022    3:24 PM  GAD 7 : Generalized Anxiety Score  Nervous, Anxious, on Edge 1  Control/stop worrying 3  Worry too much - different things 3  Trouble relaxing 0  Restless 3  Easily annoyed or irritable 3  Afraid - awful  might happen 3  Total GAD 7 Score 16

## 2022-06-12 NOTE — BH Specialist Note (Signed)
Pt did not arrive to video visit and did not answer the phone; Unable to leave voice message as mailbox is full; left MyChart message for patient.   

## 2022-06-23 ENCOUNTER — Ambulatory Visit: Payer: Medicaid Other | Admitting: Clinical

## 2022-06-23 DIAGNOSIS — Z91199 Patient's noncompliance with other medical treatment and regimen due to unspecified reason: Secondary | ICD-10-CM

## 2022-07-10 ENCOUNTER — Ambulatory Visit: Payer: BC Managed Care – PPO

## 2022-07-10 ENCOUNTER — Other Ambulatory Visit: Payer: BC Managed Care – PPO

## 2022-09-12 IMAGING — US US OB TRANSVAGINAL
2 series · 14 of 28 positions shown · non-contrast
Comparison: None.

CLINICAL DATA: History of vaginal bleeding with positive pregnancy
test

EXAM:
OBSTETRIC <14 WK US AND TRANSVAGINAL OB US
TECHNIQUE: Both transabdominal and transvaginal ultrasound examinations were
performed for complete evaluation of the gestation as well as the
maternal uterus, adnexal regions, and pelvic cul-de-sac.
Transvaginal technique was performed to assess early pregnancy.

[Series 1: us ob transvaginal · 7 of 79 slices shown (1 of 2)]
[im 7/79]
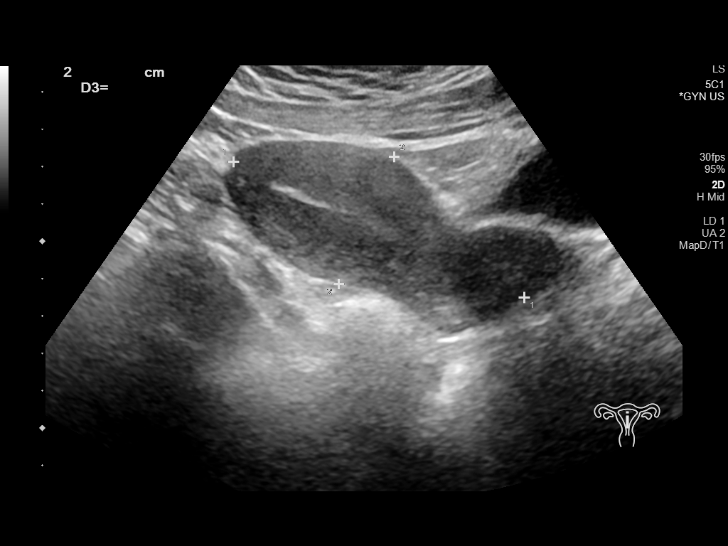
[im 19/79]
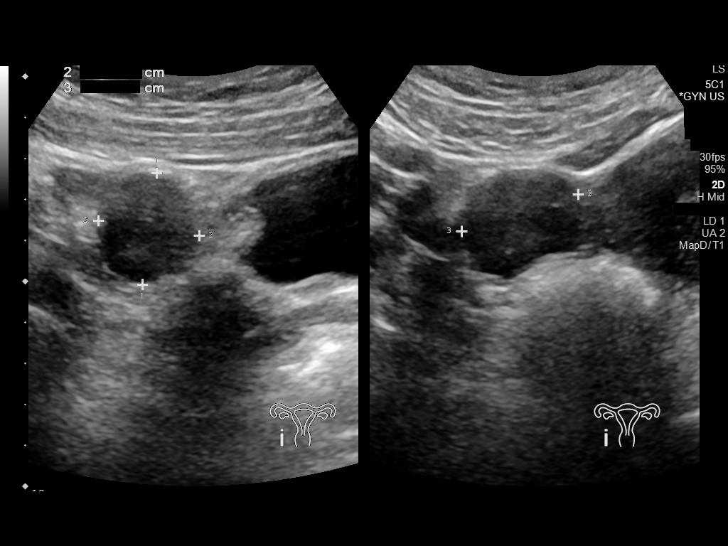
[im 31/79]
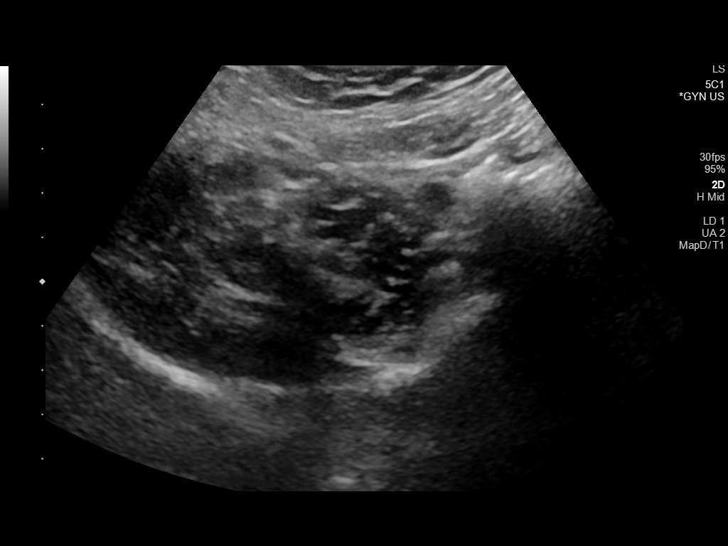
[im 43/79]
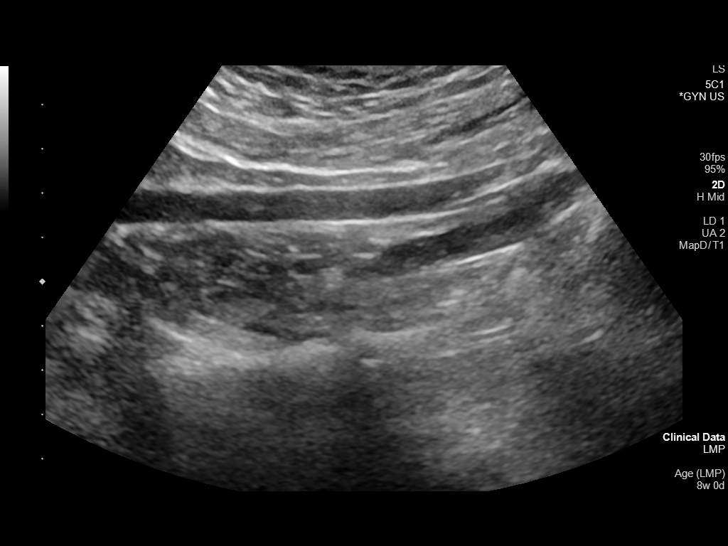
[im 55/79]
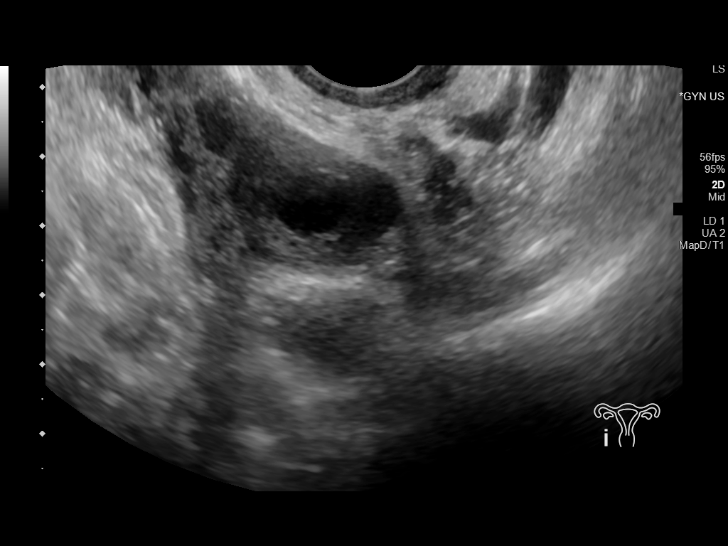
[im 67/79]
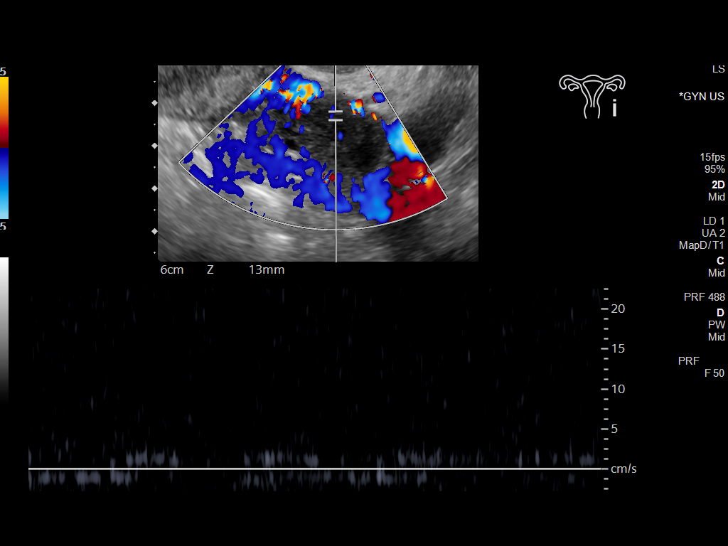
[im 79/79]
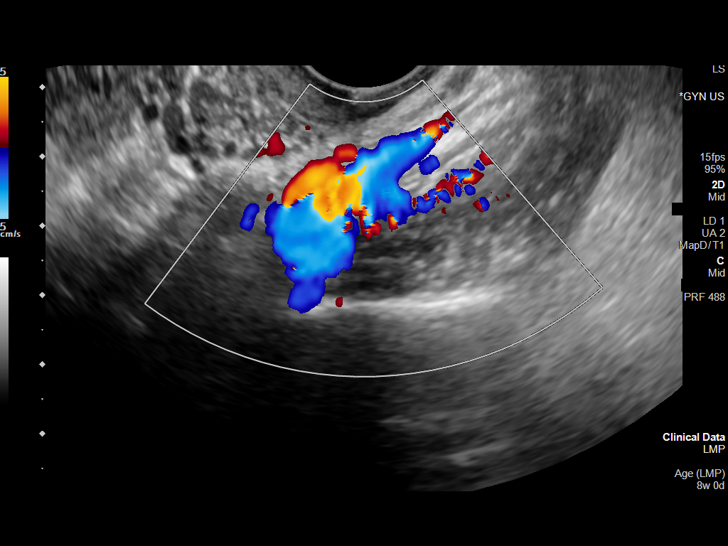

[Series 1: us ob transvaginal · 7 of 79 slices shown (2 of 2)]
[im 7/79]
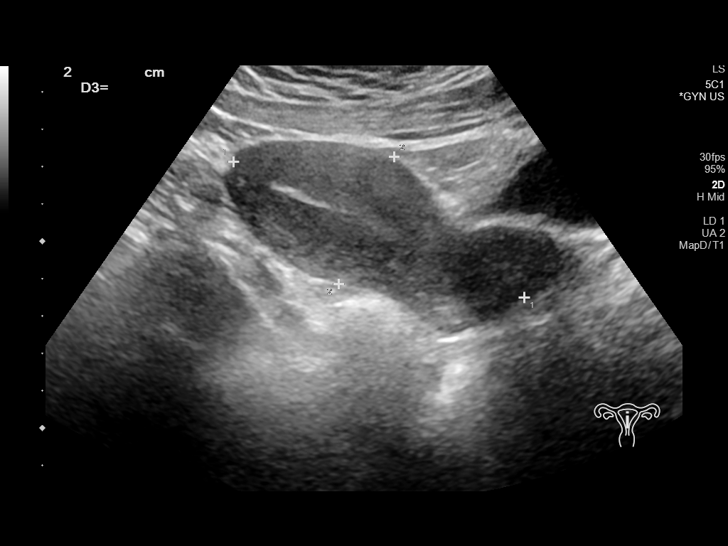
[im 19/79]
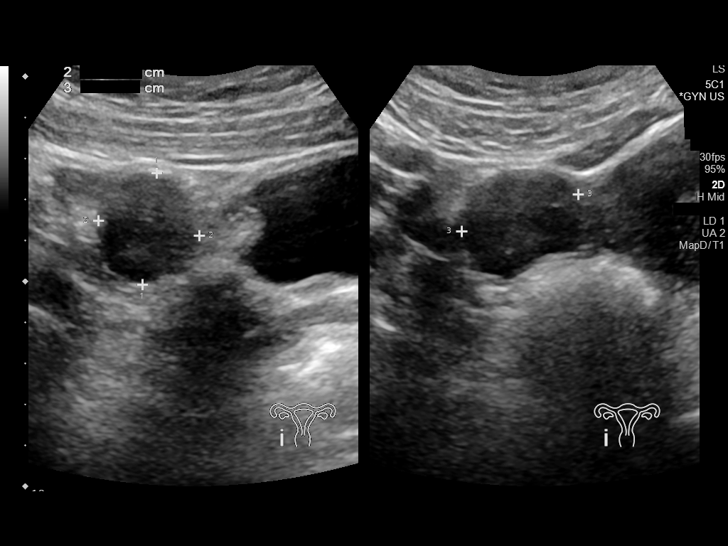
[im 31/79]
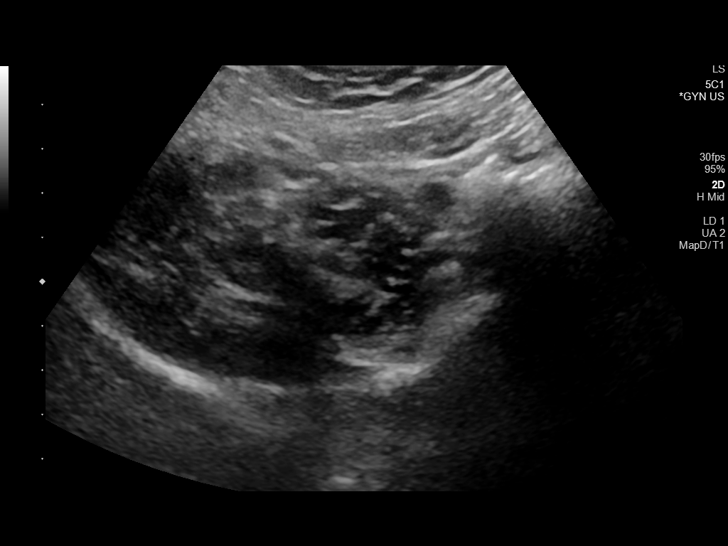
[im 43/79]
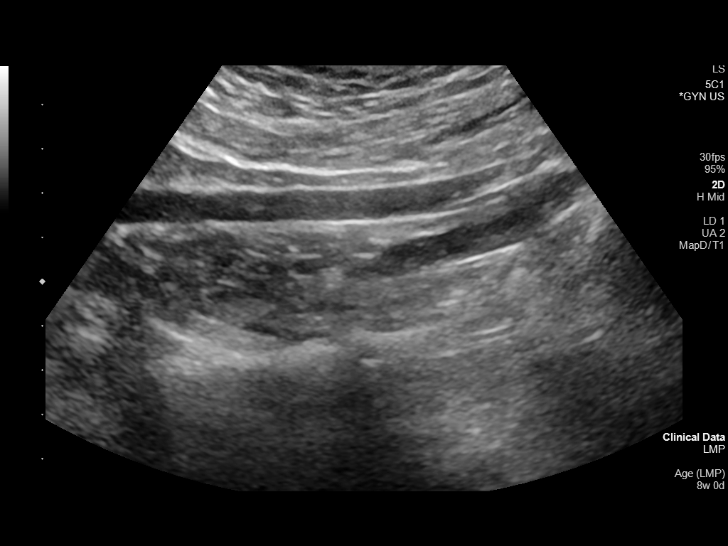
[im 55/79]
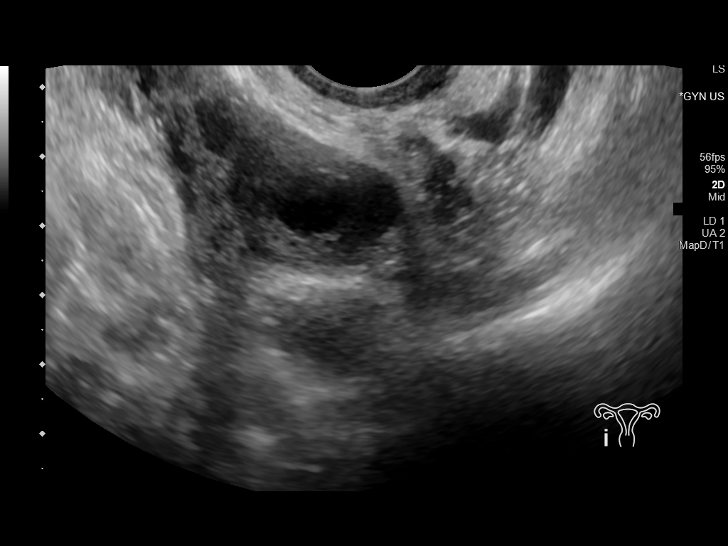
[im 67/79]
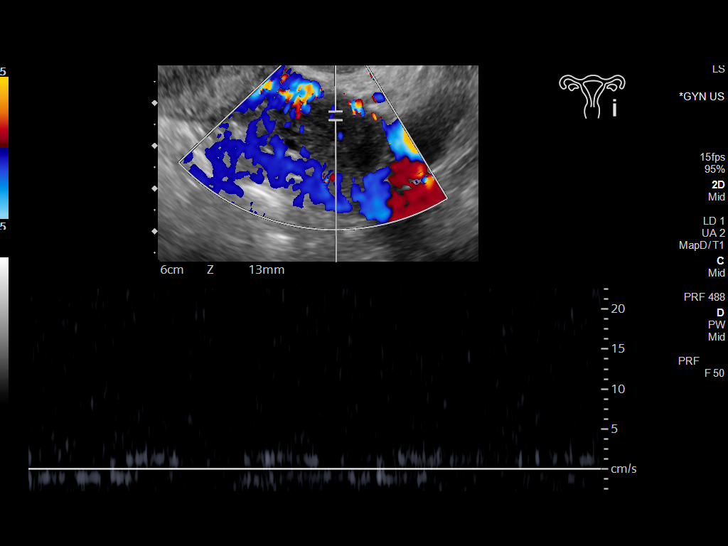
[im 79/79]
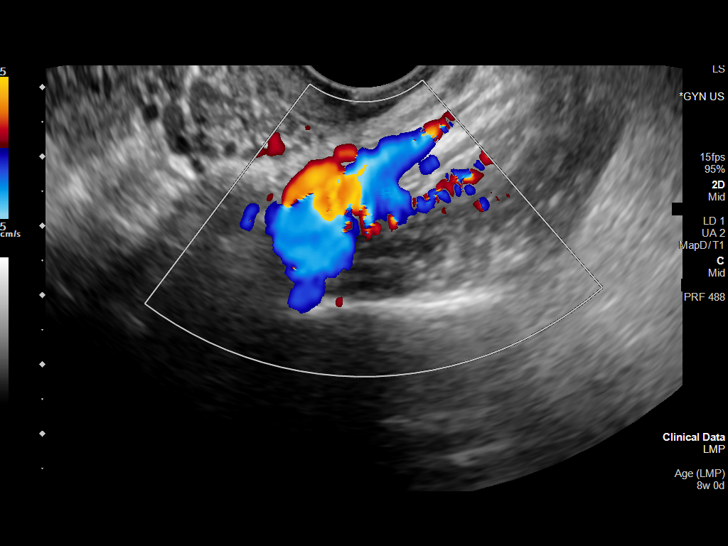

[14 of 28 positions shown; findings below may reference images not displayed]

FINDINGS: Intrauterine gestational sac: Absent

Maternal uterus/adnexae: Uterus is well visualized with normal
appearing endometrium. No evidence of intrauterine or extrauterine
gestation is noted. Ovaries are well visualized. Corpus luteum cyst
is noted within the right ovary. No acute abnormality is identified.
IMPRESSION: No evidence of intrauterine or extra uterine gestation. Given the
decreasing beta HCG level, it is possible that the recent vaginal
bleeding representative spontaneous abortion. Clinical correlation
is recommended.

## 2022-12-18 ENCOUNTER — Ambulatory Visit
Admission: EM | Admit: 2022-12-18 | Discharge: 2022-12-18 | Disposition: A | Discharge status: Home / Self Care | Payer: BC Managed Care – PPO | Attending: Nurse Practitioner | Admitting: Nurse Practitioner

## 2022-12-18 DIAGNOSIS — M722 Plantar fascial fibromatosis: Secondary | ICD-10-CM

## 2022-12-18 MED ORDER — NAPROXEN 500 MG PO TABS: 500.0000 mg | ORAL_TABLET | Freq: Two times a day (BID) | ORAL | 0 refills | Status: AC

## 2022-12-18 NOTE — ED Provider Notes (Addendum)
[facility]    CSN: [id number] Arrival date & time: 12/18/22  1449      History   Chief Complaint Chief Complaint  Patient presents with   Foot Pain    HPI Margaret Sosa is a 27 y.o. female presents for foot pain.  Patient reports persistent right foot pain over the past week.  States it is worse in the morning and slowly slightly improves throughout the day.  Occurred after she was running downhill.  No injury.  No numbness or tingling or bruising.  Does states it feels swollen.  Pain is primarily to the sole of the foot/heel of the foot.  No history of right foot surgeries or fractures in the past.  She has been taking BC powder with minimal improvement.  No other concerns at this time.   Foot Pain    Past Medical History:  Diagnosis Date   HSV-2 (herpes simplex virus 2) infection    Obesity (BMI 35.0-39.9 without comorbidity) 12/21/2016   Recurrent UTI (urinary tract infection)     Patient Active Problem List   Diagnosis Date Noted   HSV-2 infection 05/21/2022   Obesity (BMI 35.0-39.9 without comorbidity) 12/21/2016    Past Surgical History:  Procedure Laterality Date   NO PAST SURGERIES      OB History     Gravida  2   Para  0   Term  0   Preterm  0   AB  1   Living  0      SAB  1   IAB  0   Ectopic  0   Multiple  0   Live Births  0            Home Medications    Prior to Admission medications   Medication Sig Start Date End Date Taking? Authorizing Provider  doxylamine, Sleep, (UNISOM SLEEPTABS) 25 MG tablet Take 1 tablet (25 mg total) by mouth at bedtime as needed. Patient not taking: Reported on 05/16/2022 05/02/22   Katrinka Blazing, [place], CNM  metroNIDAZOLE (METROGEL) 1 % gel Apply topically daily. 05/02/22   Katrinka Blazing, [place], CNM  miconazole (MICOTIN) 2 % cream Apply 1 Application topically 2 (two) times daily. Patient not taking: Reported on 05/16/2022 04/07/22   Domenick Gong, MD  pyridOXINE (VITAMIN B6) 100 MG  tablet Take 1 tablet (100 mg total) by mouth 2 (two) times daily. Patient not taking: Reported on 05/16/2022 05/02/22   Dorathy Kinsman, CNM    Family History Family History  Problem Relation Age of Onset   Hypertension Mother    Diabetes Sister 4    Social History Social History   Tobacco Use   Smoking status: Never   Smokeless tobacco: Never  Vaping Use   Vaping Use: Never used  Substance Use Topics   Alcohol use: Not Currently   Drug use: Not Currently    Types: Marijuana    Comment: not since pregnancy     Allergies   Patient has no known allergies.   Review of Systems Review of Systems  Musculoskeletal:        Left foot pain      Physical Exam Triage Vital Signs ED Triage Vitals  Enc Vitals Group     BP 12/18/22 1458 137/88     Pulse Rate 12/18/22 1456 87     Resp 12/18/22 1456 17     Temp 12/18/22 1456 98 F (36.7 C)     Temp Source 12/18/22 1456 Oral  SpO2 12/18/22 1456 98 %     Weight --      Height --      Head Circumference --      Peak Flow --      Pain Score 12/18/22 1455 5     Pain Loc --      Pain Edu? --      Excl. in GC? --    No data found.  Updated Vital Signs BP 137/88   Pulse 87   Temp 98 F (36.7 C) (Oral)   Resp 17   LMP 12/08/2022 (Exact Date)   SpO2 98%   Breastfeeding Unknown   Visual Acuity Right Eye Distance:   Left Eye Distance:   Bilateral Distance:    Right Eye Near:   Left Eye Near:    Bilateral Near:     Physical Exam Vitals and nursing note reviewed.  Constitutional:      Appearance: Normal appearance.  HENT:     Head: Normocephalic and atraumatic.  Eyes:     Pupils: Pupils are equal, round, and reactive to light.  Cardiovascular:     Rate and Rhythm: Normal rate.  Pulmonary:     Effort: Pulmonary effort is normal.  Musculoskeletal:       Feet:  Feet:     Comments: No swelling or bruising of the right foot.  Moderate tenderness palpation to the plantar fascia.  No tenderness palpation to  the ankle or dorsum of the foot.  Full range of motion of foot with pain on active dorsi flexion.  DP +2. Skin:    General: Skin is warm and dry.  Neurological:     General: No focal deficit present.     Mental Status: She is alert and oriented to person, place, and time.  Psychiatric:        Mood and Affect: Mood normal.        Behavior: Behavior normal.      UC Treatments / Results  Labs (all labs ordered are listed, but only abnormal results are displayed) Labs Reviewed - No data to display  EKG   Radiology No results found.  Procedures Procedures (including critical care time)  Medications Ordered in UC Medications - No data to display  Initial Impression / Assessment and Plan / UC Course  I have reviewed the triage vital signs and the nursing notes.  Pertinent labs & imaging results that were available during my care of the patient were reviewed by me and considered in my medical decision making (see chart for details).     Reviewed exam and symptoms with patient.  Discussed symptoms consistent with plantar fasciitis.  Will start naproxen twice daily for 7 days.  Discussed supportive shoes/inserts and morning exercises to stretch out the area.  Will follow-up with podiatry if symptoms do not improve.  ER precautions reviewed and patient verbalized understanding Final Clinical Impressions(s) / UC Diagnoses   Final diagnoses:  Plantar fasciitis     Discharge Instructions      Naproxen twice daily for 7 days.  Wear supportive shoes over the next couple of weeks and avoid strenuous activity.  Follow-up with podiatry if your symptoms do not improve.  Please go to emergency room for any worsening symptoms.  I hope you feel better soon!   ED Prescriptions   None    PDMP not reviewed this encounter.   Radford Pax, NP 12/18/22 1522    Radford Pax, NP 12/18/22 [phone]

## 2022-12-18 NOTE — ED Triage Notes (Signed)
Pt presents with c/o Rt foot pain.   Pt states she went for a run and did exercises last week and started feeling pain in her ankle and foot. Pt has taken BC powder and ibuprofen.

## 2022-12-18 NOTE — Discharge Instructions (Signed)
Naproxen twice daily for 7 days.  Wear supportive shoes over the next couple of weeks and avoid strenuous activity.  Follow-up with podiatry if your symptoms do not improve.  Please go to emergency room for any worsening symptoms.  I hope you feel better soon!

## 2023-03-16 ENCOUNTER — Emergency Department (HOSPITAL_COMMUNITY)
Admission: EM | Admit: 2023-03-16 | Discharge: 2023-03-16 | Disposition: A | Discharge status: Home / Self Care | Payer: BC Managed Care – PPO | Attending: Emergency Medicine | Admitting: Emergency Medicine

## 2023-03-16 ENCOUNTER — Encounter (HOSPITAL_COMMUNITY): Payer: Self-pay

## 2023-03-16 ENCOUNTER — Other Ambulatory Visit: Payer: Self-pay

## 2023-03-16 DIAGNOSIS — R109 Unspecified abdominal pain: Secondary | ICD-10-CM | POA: Insufficient documentation

## 2023-03-16 DIAGNOSIS — R112 Nausea with vomiting, unspecified: Secondary | ICD-10-CM | POA: Insufficient documentation

## 2023-03-16 DIAGNOSIS — R000 Tachycardia, unspecified: Secondary | ICD-10-CM | POA: Diagnosis not present

## 2023-03-16 LAB — CBC WITH DIFFERENTIAL/PLATELET
Abs Immature Granulocytes: 0.02 10*3/uL (ref 0.00–0.07)
Basophils Absolute: 0 10*3/uL (ref 0.0–0.1)
Basophils Relative: 0 %
Eosinophils Absolute: 0.1 10*3/uL (ref 0.0–0.5)
Eosinophils Relative: 1 %
HCT: 38.5 % (ref 36.0–46.0)
Hemoglobin: 12.1 g/dL (ref 12.0–15.0)
Immature Granulocytes: 0 %
Lymphocytes Relative: 35 %
Lymphs Abs: 2.9 10*3/uL (ref 0.7–4.0)
MCH: 26.4 pg (ref 26.0–34.0)
MCHC: 31.4 g/dL (ref 30.0–36.0)
MCV: 84.1 fL (ref 80.0–100.0)
Monocytes Absolute: 0.6 10*3/uL (ref 0.1–1.0)
Monocytes Relative: 8 %
Neutro Abs: 4.5 10*3/uL (ref 1.7–7.7)
Neutrophils Relative %: 56 %
Platelets: 344 10*3/uL (ref 150–400)
RBC: 4.58 MIL/uL (ref 3.87–5.11)
RDW: 15 % (ref 11.5–15.5)
WBC: 8.2 10*3/uL (ref 4.0–10.5)
nRBC: 0 % (ref 0.0–0.2)

## 2023-03-16 LAB — COMPREHENSIVE METABOLIC PANEL
ALT: 14 U/L (ref 0–44)
AST: 20 U/L (ref 15–41)
Albumin: 3.9 g/dL (ref 3.5–5.0)
Alkaline Phosphatase: 45 U/L (ref 38–126)
Anion gap: 12 (ref 5–15)
BUN: 12 mg/dL (ref 6–20)
CO2: 20 mmol/L — ABNORMAL LOW (ref 22–32)
Calcium: 9 mg/dL (ref 8.9–10.3)
Chloride: 103 mmol/L (ref 98–111)
Creatinine, Ser: 0.86 mg/dL (ref 0.44–1.00)
GFR, Estimated: >60 mL/min (ref >60)
Glucose, Bld: 144 mg/dL — ABNORMAL HIGH (ref 70–99)
Potassium: 3.5 mmol/L (ref 3.5–5.1)
Sodium: 135 mmol/L (ref 135–145)
Total Bilirubin: 0.3 mg/dL (ref 0.3–1.2)
Total Protein: 7.7 g/dL (ref 6.5–8.1)

## 2023-03-16 LAB — HCG, SERUM, QUALITATIVE: Preg, Serum: NEGATIVE

## 2023-03-16 LAB — LIPASE, BLOOD: Lipase: 24 U/L (ref 11–51)

## 2023-03-16 MED ORDER — ONDANSETRON HCL 4 MG PO TABS: 4.0000 mg | ORAL_TABLET | Freq: Four times a day (QID) | ORAL | 0 refills | Status: DC

## 2023-03-16 MED ORDER — ONDANSETRON 4 MG PO TBDP
4.0000 mg | ORAL_TABLET | Freq: Once | ORAL | Status: AC | PRN
  Administered 2023-03-16: 4 mg via ORAL
  Filled 2023-03-16: qty 1

## 2023-03-16 MED ORDER — DROPERIDOL 2.5 MG/ML IJ SOLN
1.2500 mg | Freq: Once | INTRAMUSCULAR | Status: AC
  Administered 2023-03-16: 1.25 mg via INTRAVENOUS
  Filled 2023-03-16: qty 2

## 2023-03-16 MED ORDER — SODIUM CHLORIDE 0.9 % IV BOLUS
1000.0000 mL | Freq: Once | INTRAVENOUS | Status: AC
  Administered 2023-03-16: 1000 mL via INTRAVENOUS

## 2023-03-16 NOTE — ED Provider Notes (Signed)
[place] EMERGENCY DEPARTMENT AT [facility] Provider Note   CSN: [id number] Arrival date & time: 03/16/23  9563     History  Chief Complaint  Patient presents with   Abdominal Pain   Emesis    Margaret Sosa is a 27 y.o. female with medical history of recurrent UTI, HSV 2.  Patient presents to ED for evaluation of nausea, vomiting abdominal pain.  The patient reports that for the last 3 days she has had constant nausea, vomiting and abdominal pain.  She reports that she consumes marijuana every day.  She denies fevers at home, chest pain, shortness of breath, diarrhea, dysuria.  Denies medications prior to arrival.  States that preceding symptoms, she did eat a barbecue pork sandwich from a restaurant.  Denies any other people that she knew consumed at this food.   Abdominal Pain Associated symptoms: nausea and vomiting   Associated symptoms: no chest pain, no diarrhea, no dysuria, no fever and no shortness of breath   Emesis Associated symptoms: abdominal pain   Associated symptoms: no diarrhea and no fever        Home Medications Prior to Admission medications   Medication Sig Start Date End Date Taking? Authorizing Provider  doxylamine, Sleep, (UNISOM SLEEPTABS) 25 MG tablet Take 1 tablet (25 mg total) by mouth at bedtime as needed. Patient not taking: Reported on 05/16/2022 05/02/22   Katrinka Blazing, [place], CNM  metroNIDAZOLE (METROGEL) 1 % gel Apply topically daily. Patient not taking: Reported on 03/16/2023 05/02/22   Katrinka Blazing, [place], CNM  miconazole (MICOTIN) 2 % cream Apply 1 Application topically 2 (two) times daily. Patient not taking: Reported on 05/16/2022 04/07/22   Domenick Gong, MD  pyridOXINE (VITAMIN B6) 100 MG tablet Take 1 tablet (100 mg total) by mouth 2 (two) times daily. Patient not taking: Reported on 05/16/2022 05/02/22   Dorathy Kinsman, CNM      Allergies    Patient has no known allergies.    Review of Systems   Review of Systems   Constitutional:  Negative for fever.  Respiratory:  Negative for shortness of breath.   Cardiovascular:  Negative for chest pain.  Gastrointestinal:  Positive for abdominal pain, nausea and vomiting. Negative for diarrhea.  Genitourinary:  Negative for dysuria.  All other systems reviewed and are negative.   Physical Exam Updated Vital Signs BP (!) 153/128 (BP Location: Left Arm)   Pulse (!) 105   Temp 98.4 F (36.9 C) (Oral)   Resp 17   Ht 5\' 4"  (1.626 m)   Wt 97.1 kg   SpO2 100%   BMI 36.74 kg/m  Physical Exam Vitals and nursing note reviewed.  Constitutional:      General: She is not in acute distress.    Appearance: Normal appearance. She is not ill-appearing, toxic-appearing or diaphoretic.  HENT:     Head: Normocephalic and atraumatic.     Nose: Nose normal.     Mouth/Throat:     Mouth: Mucous membranes are moist.     Pharynx: Oropharynx is clear.  Eyes:     Extraocular Movements: Extraocular movements intact.     Conjunctiva/sclera: Conjunctivae normal.     Pupils: Pupils are equal, round, and reactive to light.  Cardiovascular:     Rate and Rhythm: Regular rhythm. Tachycardia present.  Pulmonary:     Effort: Pulmonary effort is normal.     Breath sounds: Normal breath sounds. No wheezing.  Abdominal:     General: Abdomen is flat. Bowel  sounds are normal.     Palpations: Abdomen is soft.     Tenderness: There is no abdominal tenderness.  Musculoskeletal:     Cervical back: Normal range of motion and neck supple. No tenderness.  Skin:    General: Skin is warm and dry.     Capillary Refill: Capillary refill takes less than 2 seconds.  Neurological:     Mental Status: She is alert and oriented to person, place, and time.     ED Results / Procedures / Treatments   Labs (all labs ordered are listed, but only abnormal results are displayed) Labs Reviewed  COMPREHENSIVE METABOLIC PANEL - Abnormal; Notable for the following components:      Result Value    CO2 20 (*)    Glucose, Bld 144 (*)    All other components within normal limits  LIPASE, BLOOD  CBC WITH DIFFERENTIAL/PLATELET  HCG, SERUM, QUALITATIVE  URINALYSIS, ROUTINE W REFLEX MICROSCOPIC    EKG EKG Interpretation Date/Time:  Friday March 16 2023 05:22:45 EDT Ventricular Rate:  88 PR Interval:  170 QRS Duration:  103 QT Interval:  363 QTC Calculation: 440 R Axis:   87  Text Interpretation: Sinus rhythm Interpretation limited secondary to artifact Confirmed by Zadie Rhine ([id number]) on 03/16/2023 5:27:38 AM  Radiology No results found.  Procedures Procedures   Medications Ordered in ED Medications  ondansetron (ZOFRAN-ODT) disintegrating tablet 4 mg (4 mg Oral Given 03/16/23 0413)  sodium chloride 0.9 % bolus 1,000 mL (1,000 mLs Intravenous New Bag/Given 03/16/23 0512)  droperidol (INAPSINE) 2.5 MG/ML injection 1.25 mg (1.25 mg Intravenous Given 03/16/23 0507)    ED Course/ Medical Decision Making/ A&P  Medical Decision Making Amount and/or Complexity of Data Reviewed Labs: ordered.  Risk Prescription drug management.   27 year old female presents to ED for evaluation.  Please see HPI for further details.  On examination patient is afebrile, tachycardic to 105.  Lung sounds are clear bilaterally, she is not hypoxic.  Abdomen is soft and compressible throughout with no tenderness noted.  Neurological examination at baseline.  Patient overall nontoxic in appearance.  Patient provided 1 L fluid, 2.5 mg droperidol with a suspect the patient has cannabinoid hyperemesis syndrome.  EKG obtained prior to administering droperidol, QTc not prolonged.  Patient CBC without leukocytosis or anemia.  Metabolic panel without electrolyte derangement, no elevated LFTs, anion gap 12, creatinine not elevated.  The patient lipase is WNL.  First test negative.  Urinalysis pending at this time.  After droperidol administered, patient seen to be resting comfortably in bed.  Pulse  rate is normalized.  Patient will need to pass p.o. fluid challenge prior to discharge.  Patient will be signed out to oncoming provider Margaret Sheer, PA-C pending p.o. fluid challenge.   Final Clinical Impression(s) / ED Diagnoses Final diagnoses:  Nausea and vomiting, unspecified vomiting type    Rx / DC Orders ED Discharge Orders     None         Al Decant, PA-C 03/16/23 1914    Zadie Rhine, MD 03/16/23 [phone]

## 2023-03-16 NOTE — ED Provider Notes (Signed)
Accepted handoff at shift change from [facility]. Please see prior provider note for more detail.   Briefly: Patient is 27 y.o. presenting for 3 days of nausea vomiting abdominal pain.  Patient is daily marijuana user.  Abdomen is soft and nontender on exam.  DDX: concern for CHS, intra-abdominal infection, electrolyte derangement, pregnancy  Plan: Reassess after fluids, droperidol and Zofran.  If symptoms have improved and patient can p.o. challenge, likely discharge with PCP follow-up.   Physical Exam  BP (!) 179/115 (BP Location: Right Arm)   Pulse 97   Temp 98.3 F (36.8 C) (Oral)   Resp 16   Ht 5\' 4"  (1.626 m)   Wt 97.1 kg   SpO2 98%   BMI 36.74 kg/m   Physical Exam  Procedures  Procedures  ED Course / MDM   Clinical Course as of 03/16/23 1015  Fri Mar 16, 2023  0631 CHS, reassess after treatement. PO challenged. Likely dc [JR]    Clinical Course User Index [JR] Gareth Eagle, PA-C   Medical Decision Making Amount and/or Complexity of Data Reviewed Labs: ordered.  Risk Prescription drug management.   On reassessment, patient states she felt much better and was ready to go home.  Vitals remained stable.  Fluid challenge with no issue.  Patient states she was no longer nauseous.  I did send some Zofran to her pharmacy to be used as needed.  Advised conservative rehydration at home and follow-up with PCP.  Discussed return precautions.  Discharged in good condition.       Gareth Eagle, PA-C 03/16/23 1017    Linwood Dibbles, MD 03/17/23 1537

## 2023-03-16 NOTE — Discharge Instructions (Signed)
Evaluation today for nausea vomiting is overall reassuring.  Suspect it is related to your marijuana use.  Sending Zofran to your pharmacy to be used as needed for nausea and vomiting.  In the meantime recommend assertive hydration with water and Gatorade.  If your symptoms get worse or return, please return emerged part further evaluation.  Otherwise recommend follow-up with your PCP.

## 2023-03-16 NOTE — ED Triage Notes (Addendum)
Pt reports with lower abdominal pain, diarrhea, and vomiting x 2 days. Pt states that she feels like she has food poison.

## 2023-04-05 ENCOUNTER — Ambulatory Visit (HOSPITAL_COMMUNITY)
Admission: RE | Admit: 2023-04-05 | Discharge: 2023-04-05 | Disposition: A | Discharge status: Home / Self Care | Payer: BC Managed Care – PPO | Source: Ambulatory Visit | Attending: Emergency Medicine | Admitting: Emergency Medicine

## 2023-04-05 ENCOUNTER — Encounter (HOSPITAL_COMMUNITY): Payer: Self-pay

## 2023-04-05 VITALS — BP 132/86 | HR 96 | Temp 98.5°F | Resp 16

## 2023-04-05 DIAGNOSIS — N760 Acute vaginitis: Secondary | ICD-10-CM | POA: Insufficient documentation

## 2023-04-05 DIAGNOSIS — Z8742 Personal history of other diseases of the female genital tract: Secondary | ICD-10-CM | POA: Insufficient documentation

## 2023-04-05 MED ORDER — METRONIDAZOLE 500 MG PO TABS: 500.0000 mg | ORAL_TABLET | Freq: Two times a day (BID) | ORAL | 0 refills | Status: DC

## 2023-04-05 MED ORDER — FLUCONAZOLE 150 MG PO TABS: ORAL_TABLET | ORAL | 0 refills | Status: DC

## 2023-04-05 NOTE — ED Triage Notes (Signed)
Patient c/o vaginal discharge x 10 days ago. Patient states she then had her period and when that finished she noticed she still had the clear vaginal discharge. Paatiaent also c/o slight vaginal itching.

## 2023-04-05 NOTE — Discharge Instructions (Signed)
Take medications as prescribed for likely BV and yeast. We are sending out swab and will contact you with results.  Follow-up with PCP or gyn if persistent symptoms or concerns.

## 2023-04-05 NOTE — ED Provider Notes (Signed)
[facility]    CSN: [id number] Arrival date & time: 04/05/23  1549      History   Chief Complaint Chief Complaint  Patient presents with   Vaginal Discharge    HPI Margaret Sosa is a 27 y.o. female.   Patient presents with concerns of possible BV and/or yeast infection. She reports vaginal discharge, itching/irritation, and odor. She states it started about two weeks ago, seemed to resolve while she was on her period, then returned. She denies dysuria, frequency/urgency, abdominal or back pain, n/v, or fever. She reports prior similar with past BV and yeast infections, stating she seems to get them frequently and often gets them together.   The history is provided by the patient.  Vaginal Discharge Associated symptoms: no abdominal pain, no dysuria, no fever, no nausea and no vomiting     Past Medical History:  Diagnosis Date   HSV-2 (herpes simplex virus 2) infection    Obesity (BMI 35.0-39.9 without comorbidity) 12/21/2016   Recurrent UTI (urinary tract infection)     Patient Active Problem List   Diagnosis Date Noted   HSV-2 infection 05/21/2022   Obesity (BMI 35.0-39.9 without comorbidity) 12/21/2016    Past Surgical History:  Procedure Laterality Date   NO PAST SURGERIES      OB History     Gravida  2   Para  0   Term  0   Preterm  0   AB  1   Living  0      SAB  1   IAB  0   Ectopic  0   Multiple  0   Live Births  0            Home Medications    Prior to Admission medications   Medication Sig Start Date End Date Taking? Authorizing Provider  fluconazole (DIFLUCAN) 150 MG tablet Take one tablet once. Repeat in 3 days if symptoms persist. 04/05/23  Yes Shellyann Wandrey L, PA  metroNIDAZOLE (FLAGYL) 500 MG tablet Take 1 tablet (500 mg total) by mouth 2 (two) times daily. 04/05/23  Yes Azharia Surratt L, PA  doxylamine, Sleep, (UNISOM SLEEPTABS) 25 MG tablet Take 1 tablet (25 mg total) by mouth at bedtime as needed. Patient  not taking: Reported on 05/16/2022 05/02/22   Katrinka Blazing, [place], CNM  metroNIDAZOLE (METROGEL) 1 % gel Apply topically daily. Patient not taking: Reported on 03/16/2023 05/02/22   Katrinka Blazing, [place], CNM  miconazole (MICOTIN) 2 % cream Apply 1 Application topically 2 (two) times daily. Patient not taking: Reported on 05/16/2022 04/07/22   Domenick Gong, MD  ondansetron (ZOFRAN) 4 MG tablet Take 1 tablet (4 mg total) by mouth every 6 (six) hours. 03/16/23   Gareth Eagle, PA-C  pyridOXINE (VITAMIN B6) 100 MG tablet Take 1 tablet (100 mg total) by mouth 2 (two) times daily. Patient not taking: Reported on 05/16/2022 05/02/22   Dorathy Kinsman, CNM    Family History Family History  Problem Relation Age of Onset   Hypertension Mother    Diabetes Sister 4    Social History Social History   Tobacco Use   Smoking status: Never   Smokeless tobacco: Never  Vaping Use   Vaping status: Never Used  Substance Use Topics   Alcohol use: Not Currently   Drug use: Not Currently    Types: Marijuana    Comment: not since pregnancy     Allergies   Patient has no known allergies.   Review of  Systems Review of Systems  Constitutional:  Negative for fatigue and fever.  Gastrointestinal:  Negative for abdominal pain, diarrhea, nausea and vomiting.  Genitourinary:  Positive for vaginal discharge. Negative for dysuria, frequency, genital sores and urgency.  Musculoskeletal:  Negative for back pain.  Skin:  Negative for rash.     Physical Exam Triage Vital Signs ED Triage Vitals  Encounter Vitals Group     BP 04/05/23 1651 132/86     Systolic BP Percentile --      Diastolic BP Percentile --      Pulse Rate 04/05/23 1651 96     Resp 04/05/23 1651 16     Temp 04/05/23 1651 98.5 F (36.9 C)     Temp Source 04/05/23 1651 Oral     SpO2 04/05/23 1651 96 %     Weight --      Height --      Head Circumference --      Peak Flow --      Pain Score 04/05/23 1654 0     Pain Loc --      Pain  Education --      Exclude from Growth Chart --    No data found.  Updated Vital Signs BP 132/86 (BP Location: Left Arm)   Pulse 96   Temp 98.5 F (36.9 C) (Oral)   Resp 16   LMP 03/30/2023   SpO2 96%   Visual Acuity Right Eye Distance:   Left Eye Distance:   Bilateral Distance:    Right Eye Near:   Left Eye Near:    Bilateral Near:     Physical Exam Vitals and nursing note reviewed.  Constitutional:      General: She is not in acute distress. Cardiovascular:     Rate and Rhythm: Normal rate and regular rhythm.     Heart sounds: Normal heart sounds.  Pulmonary:     Effort: Pulmonary effort is normal.     Breath sounds: Normal breath sounds.  Abdominal:     Palpations: Abdomen is soft.     Tenderness: There is no abdominal tenderness. There is no right CVA tenderness, left CVA tenderness, guarding or rebound.  Genitourinary:    Comments: Pt declined Neurological:     Mental Status: She is alert.  Psychiatric:        Mood and Affect: Mood normal.      UC Treatments / Results  Labs (all labs ordered are listed, but only abnormal results are displayed) Labs Reviewed  CERVICOVAGINAL ANCILLARY ONLY    EKG   Radiology No results found.  Procedures Procedures (including critical care time)  Medications Ordered in UC Medications - No data to display  Initial Impression / Assessment and Plan / UC Course  I have reviewed the triage vital signs and the nursing notes.  Pertinent labs & imaging results that were available during my care of the patient were reviewed by me and considered in my medical decision making (see chart for details).     Discussed with pt empiric tx for BV and yeast vs waiting for lab results and she would like to go ahead and start medicine. Will send in medication and swab for testing. Discussed return precautions.   E/M: 1 acute uncomplicated illness, 1 data, moderate risk due to prescription management  Final Clinical  Impressions(s) / UC Diagnoses   Final diagnoses:  Acute vaginitis     Discharge Instructions      Take medications as prescribed for  likely BV and yeast. We are sending out swab and will contact you with results.  Follow-up with PCP or gyn if persistent symptoms or concerns.     ED Prescriptions     Medication Sig Dispense Auth. Provider   metroNIDAZOLE (FLAGYL) 500 MG tablet Take 1 tablet (500 mg total) by mouth 2 (two) times daily. 14 tablet Filippa Yarbough L, PA   fluconazole (DIFLUCAN) 150 MG tablet Take one tablet once. Repeat in 3 days if symptoms persist. 2 tablet Vallery Sa, Mel Langan L, PA      PDMP not reviewed this encounter.   Estanislado Pandy, [place] 04/05/23 1806

## 2023-04-06 LAB — CERVICOVAGINAL ANCILLARY ONLY
Bacterial Vaginitis (gardnerella): NEGATIVE
Candida Glabrata: NEGATIVE
Candida Vaginitis: POSITIVE — AB
Chlamydia: NEGATIVE
Comment: NEGATIVE
Comment: NEGATIVE
Comment: NEGATIVE
Comment: NEGATIVE
Comment: NEGATIVE
Comment: NORMAL
Neisseria Gonorrhea: NEGATIVE
Trichomonas: NEGATIVE

## 2023-04-14 ENCOUNTER — Telehealth: Payer: BC Managed Care – PPO | Admitting: Family Medicine

## 2023-04-14 DIAGNOSIS — B3731 Acute candidiasis of vulva and vagina: Secondary | ICD-10-CM

## 2023-04-14 DIAGNOSIS — N760 Acute vaginitis: Secondary | ICD-10-CM | POA: Diagnosis not present

## 2023-04-14 DIAGNOSIS — B9689 Other specified bacterial agents as the cause of diseases classified elsewhere: Secondary | ICD-10-CM | POA: Diagnosis not present

## 2023-04-14 MED ORDER — FLUCONAZOLE 150 MG PO TABS: ORAL_TABLET | ORAL | 0 refills | Status: DC

## 2023-04-14 MED ORDER — METRONIDAZOLE 500 MG PO TABS: 500.0000 mg | ORAL_TABLET | Freq: Two times a day (BID) | ORAL | 0 refills | Status: DC

## 2023-04-14 NOTE — Patient Instructions (Signed)
 Vaginal Yeast Infection, Adult  Vaginal yeast infection is a condition that causes vaginal discharge as well as soreness, swelling, and redness (inflammation) of the vagina. This is a common condition. Some women get this infection frequently. What are the causes? This condition is caused by a change in the normal balance of the yeast (Candida) and normal bacteria that live in the vagina. This change causes an overgrowth of yeast, which causes the inflammation. What increases the risk? The condition is more likely to develop in women who: Take antibiotic medicines. Have diabetes. Take birth control pills. Are pregnant. Douche often. Have a weak body defense system (immune system). Have been taking steroid medicines for a long time. Frequently wear tight clothing. What are the signs or symptoms? Symptoms of this condition include: White, thick, creamy vaginal discharge. Swelling, itching, redness, and irritation of the vagina. The lips of the vagina (labia) may be affected as well. Pain or a burning feeling while urinating. Pain during sex. How is this diagnosed? This condition is diagnosed based on: Your medical history. A physical exam. A pelvic exam. Your health care provider will examine a sample of your vaginal discharge under a microscope. Your health care provider may send this sample for testing to confirm the diagnosis. How is this treated? This condition is treated with medicine. Medicines may be over-the-counter or prescription. You may be told to use one or more of the following: Medicine that is taken by mouth (orally). Medicine that is applied as a cream (topically). Medicine that is inserted directly into the vagina (suppository). Follow these instructions at home: Take or apply over-the-counter and prescription medicines only as told by your health care provider. Do not use tampons until your health care provider approves. Do not have sex until your infection has  cleared. Sex can prolong or worsen your symptoms of infection. Ask your health care provider when it is safe to resume sexual activity. Keep all follow-up visits. This is important. How is this prevented?  Do not wear tight clothes, such as pantyhose or tight pants. Wear breathable cotton underwear. Do not use douches, perfumed soap, creams, or powders. Wipe from front to back after using the toilet. If you have diabetes, keep your blood sugar levels under control. Ask your health care provider for other ways to prevent yeast infections. Contact a health care provider if: You have a fever. Your symptoms go away and then return. Your symptoms do not get better with treatment. Your symptoms get worse. You have new symptoms. You develop blisters in or around your vagina. You have blood coming from your vagina and it is not your menstrual period. You develop pain in your abdomen. Summary Vaginal yeast infection is a condition that causes discharge as well as soreness, swelling, and redness (inflammation) of the vagina. This condition is treated with medicine. Medicines may be over-the-counter or prescription. Take or apply over-the-counter and prescription medicines only as told by your health care provider. Do not douche. Resume sexual activity or use of tampons as instructed by your health care provider. Contact a health care provider if your symptoms do not get better with treatment or your symptoms go away and then return. This information is not intended to replace advice given to you by your health care provider. Make sure you discuss any questions you have with your health care provider. Document Revised: 08/30/2020 Document Reviewed: 08/30/2020 Elsevier Patient Education  2024 [employer] Bacterial Vaginosis  Bacterial vaginosis is an infection that occurs when the  normal balance of bacteria in the vagina changes. This change is caused by an overgrowth of certain bacteria in the  vagina. Bacterial vaginosis is the most common vaginal infection among females aged 50 to 49 years. This condition increases the risk of sexually transmitted infections (STIs). Treatment can help reduce this risk. Treatment is very important for pregnant women because this condition can cause babies to be born early (prematurely) or at a low birth weight. What are the causes? This condition is caused by an increase in harmful bacteria that are normally present in small amounts in the vagina. However, the exact reason this condition develops is not known. You cannot get bacterial vaginosis from toilet seats, bedding, swimming pools, or contact with objects around you. What increases the risk? The following factors may make you more likely to develop this condition: Having a new sexual partner or multiple sexual partners, or having unprotected sex. Douching. Having an intrauterine device (IUD). Smoking. Abusing drugs and alcohol. This may lead to riskier sexual behavior. Taking certain antibiotic medicines. Being pregnant. What are the signs or symptoms? Some women with this condition have no symptoms. Symptoms may include: Wallace Cullens or white vaginal discharge. The discharge can be watery or foamy. A fish-like odor with discharge, especially after sex or during menstruation. Itching in and around the vagina. Burning or pain with urination. How is this diagnosed? This condition is diagnosed based on: Your medical history. A physical exam of the vagina. Checking a sample of vaginal fluid for harmful bacteria or abnormal cells. How is this treated? This condition is treated with antibiotic medicines. These may be given as a pill, a vaginal cream, or a medicine that is put into the vagina (suppository). If the condition comes back after treatment, a second round of antibiotics may be needed. Follow these instructions at home: Medicines Take or apply over-the-counter and prescription medicines only  as told by your health care provider. Take or apply your antibiotic medicine as told by your health care provider. Do not stop using the antibiotic even if you start to feel better. General instructions If you have a female sexual partner, tell her that you have a vaginal infection. She should follow up with her health care provider. If you have a female sexual partner, he does not need treatment. Avoid sexual activity until you finish treatment. Drink enough fluid to keep your urine pale yellow. Keep the area around your vagina and rectum clean. Wash the area daily with warm water. Wipe yourself from front to back after using the toilet. If you are breastfeeding, talk to your health care provider about continuing breastfeeding during treatment. Keep all follow-up visits. This is important. How is this prevented? Self-care Do not douche. Wash the outside of your vagina with warm water only. Wear cotton or cotton-lined underwear. Avoid wearing tight pants and pantyhose, especially during the summer. Safe sex Use protection when having sex. This includes: Using condoms. Using dental dams. This is a thin layer of a material made of latex or polyurethane that protects the mouth during oral sex. Limit the number of sexual partners. To help prevent bacterial vaginosis, it is best to have sex with just one partner (monogamous relationship). Make sure you and your sexual partner are tested for STIs. Drugs and alcohol Do not use any products that contain nicotine or tobacco. These products include cigarettes, chewing tobacco, and vaping devices, such as e-cigarettes. If you need help quitting, ask your health care provider. Do not use drugs. Do  not drink alcohol if: Your health care provider tells you not to do this. You are pregnant, may be pregnant, or are planning to become pregnant. If you drink alcohol: Limit how much you have to 0-1 drink a day. Be aware of how much alcohol is in your  drink. In the [place] one drink equals one 12 oz bottle of beer (355 mL), one 5 oz glass of wine (148 mL), or one 1 oz glass of hard liquor (44 mL). Where to find more information Centers for Disease Control and Prevention: FootballExhibition.com.br American Sexual Health Association (ASHA): www.ashastd.org [place] Department of Health and Health and safety inspector, Office on Women's Health: http://hoffman.com/ Contact a health care provider if: Your symptoms do not improve, even after treatment. You have more discharge or pain when urinating. You have a fever or chills. You have pain in your abdomen or pelvis. You have pain during sex. You have vaginal bleeding between menstrual periods. Summary Bacterial vaginosis is a vaginal infection that occurs when the normal balance of bacteria in the vagina changes. It results from an overgrowth of certain bacteria. This condition increases the risk of sexually transmitted infections (STIs). Getting treated can help reduce this risk. Treatment is very important for pregnant women because this condition can cause babies to be born early (prematurely) or at low birth weight. This condition is treated with antibiotic medicines. These may be given as a pill, a vaginal cream, or a medicine that is put into the vagina (suppository). This information is not intended to replace advice given to you by your health care provider. Make sure you discuss any questions you have with your health care provider. Document Revised: 12/11/2019 Document Reviewed: 12/11/2019 Elsevier Patient Education  2024 [employer].

## 2023-04-14 NOTE — Progress Notes (Signed)
Virtual Visit Consent   Margaret Sosa, you are scheduled for a virtual visit with a [facility] provider today. Just as with appointments in the office, your consent must be obtained to participate. Your consent will be active for this visit and any virtual visit you may have with one of our providers in the next 365 days. If you have a MyChart account, a copy of this consent can be sent to you electronically.  As this is a virtual visit, video technology does not allow for your provider to perform a traditional examination. This may limit your provider's ability to fully assess your condition. If your provider identifies any concerns that need to be evaluated in person or the need to arrange testing (such as labs, EKG, etc.), we will make arrangements to do so. Although advances in technology are sophisticated, we cannot ensure that it will always work on either your end or our end. If the connection with a video visit is poor, the visit may have to be switched to a telephone visit. With either a video or telephone visit, we are not always able to ensure that we have a secure connection.  By engaging in this virtual visit, you consent to the provision of healthcare and authorize for your insurance to be billed (if applicable) for the services provided during this visit. Depending on your insurance coverage, you may receive a charge related to this service.  I need to obtain your verbal consent now. Are you willing to proceed with your visit today? Margaret Sosa has provided verbal consent on 04/14/2023 for a virtual visit (video or telephone). Georgana Curio, FNP  Date: 04/14/2023 10:19 AM  Virtual Visit via Video Note   I, Georgana Curio, connected with  Margaret Sosa  ([id number], [birth year]) on 04/14/23 at 10:15 AM EDT by a video-enabled telemedicine application and verified that I am speaking with the correct person using two identifiers.  Location: Patient: Virtual Visit Location Patient:  Home Provider: Virtual Visit Location Provider: Home Office   I discussed the limitations of evaluation and management by telemedicine and the availability of in person appointments. The patient expressed understanding and agreed to proceed.    History of Present Illness: Margaret Sosa is a 27 y.o. who identifies as a female who was assigned female at birth, and is being seen today for vaginal discharge and itching with door. Marland Kitchen She was given diflucan and flagyl last week and had  family emergency and never picked it up She needs it resent. She denies fever and and pain.  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 (Patient not taking: Reported on 05/16/2022), Disp: 30 tablet, Rfl: 0   fluconazole (DIFLUCAN) 150 MG tablet, Take one tablet once. Repeat in 3 days if symptoms persist., Disp: 2 tablet, Rfl: 0   metroNIDAZOLE (FLAGYL) 500 MG tablet, Take 1 tablet (500 mg total) by mouth 2 (two) times daily., Disp: 14 tablet, Rfl: 0   metroNIDAZOLE (METROGEL) 1 % gel, Apply topically daily. (Patient not taking: Reported on 03/16/2023), Disp: 45 g, Rfl: 0   miconazole (MICOTIN) 2 % cream, Apply 1 Application topically 2 (two) times daily. (Patient not  taking: Reported on 05/16/2022), Disp: 28.35 g, Rfl: 0   ondansetron (ZOFRAN) 4 MG tablet, Take 1 tablet (4 mg total) by mouth every 6 (six) hours., Disp: 12 tablet, Rfl: 0   pyridOXINE (VITAMIN B6) 100 MG tablet, Take 1 tablet (100 mg total) by mouth 2 (two) times daily. (Patient not taking: Reported on 05/16/2022), Disp: 30 tablet, Rfl: 3  Observations/Objective: Patient is well-developed, well-nourished in no acute distress.  Resting  comfortably  at home.  Head is normocephalic, atraumatic.  No labored breathing.  Speech is clear and coherent with logical content.  Patient is alert and oriented at baseline.    Assessment and Plan: 1. Bacterial vaginitis  2. Candidiasis of vagina  Follow up with pcp or urgent care as needed   Follow Up Instructions: I discussed the assessment and treatment plan with the patient. The patient was provided an opportunity to ask questions and all were answered. The patient agreed with the plan and demonstrated an understanding of the instructions.  A copy of instructions were sent to the patient via MyChart unless otherwise noted below.     The patient was advised to call back or seek an in-person evaluation if the symptoms worsen or if the condition fails to improve as anticipated.    Georgana Curio, FNP

## 2023-06-17 ENCOUNTER — Emergency Department (HOSPITAL_COMMUNITY): Payer: BC Managed Care – PPO

## 2023-06-17 ENCOUNTER — Emergency Department (HOSPITAL_COMMUNITY)
Admission: EM | Admit: 2023-06-17 | Discharge: 2023-06-17 | Disposition: A | Discharge status: Home / Self Care | Payer: BC Managed Care – PPO | Attending: Emergency Medicine | Admitting: Emergency Medicine

## 2023-06-17 DIAGNOSIS — N132 Hydronephrosis with renal and ureteral calculous obstruction: Secondary | ICD-10-CM | POA: Diagnosis not present

## 2023-06-17 DIAGNOSIS — Z79899 Other long term (current) drug therapy: Secondary | ICD-10-CM | POA: Insufficient documentation

## 2023-06-17 DIAGNOSIS — R1032 Left lower quadrant pain: Secondary | ICD-10-CM | POA: Diagnosis present

## 2023-06-17 DIAGNOSIS — N200 Calculus of kidney: Secondary | ICD-10-CM

## 2023-06-17 DIAGNOSIS — I10 Essential (primary) hypertension: Secondary | ICD-10-CM | POA: Diagnosis not present

## 2023-06-17 LAB — URINALYSIS, ROUTINE W REFLEX MICROSCOPIC
Bilirubin Urine: NEGATIVE
Glucose, UA: NEGATIVE mg/dL
Hgb urine dipstick: NEGATIVE
Ketones, ur: NEGATIVE mg/dL
Nitrite: NEGATIVE
Protein, ur: 30 mg/dL — AB
Specific Gravity, Urine: 1.029 (ref 1.005–1.030)
pH: 5 (ref 5.0–8.0)

## 2023-06-17 LAB — COMPREHENSIVE METABOLIC PANEL
ALT: 13 U/L (ref 0–44)
AST: 20 U/L (ref 15–41)
Albumin: 3.5 g/dL (ref 3.5–5.0)
Alkaline Phosphatase: 45 U/L (ref 38–126)
Anion gap: 6 (ref 5–15)
BUN: 14 mg/dL (ref 6–20)
CO2: 23 mmol/L (ref 22–32)
Calcium: 9.2 mg/dL (ref 8.9–10.3)
Chloride: 105 mmol/L (ref 98–111)
Creatinine, Ser: 0.86 mg/dL (ref 0.44–1.00)
GFR, Estimated: >60 mL/min (ref >60)
Glucose, Bld: 192 mg/dL — ABNORMAL HIGH (ref 70–99)
Potassium: 4.3 mmol/L (ref 3.5–5.1)
Sodium: 134 mmol/L — ABNORMAL LOW (ref 135–145)
Total Bilirubin: 0.4 mg/dL (ref <1.2)
Total Protein: 7.4 g/dL (ref 6.5–8.1)

## 2023-06-17 LAB — CBC
HCT: 40 % (ref 36.0–46.0)
Hemoglobin: 12.4 g/dL (ref 12.0–15.0)
MCH: 26.3 pg (ref 26.0–34.0)
MCHC: 31 g/dL (ref 30.0–36.0)
MCV: 84.7 fL (ref 80.0–100.0)
Platelets: 336 10*3/uL (ref 150–400)
RBC: 4.72 MIL/uL (ref 3.87–5.11)
RDW: 15 % (ref 11.5–15.5)
WBC: 10.4 10*3/uL (ref 4.0–10.5)
nRBC: 0 % (ref 0.0–0.2)

## 2023-06-17 LAB — HCG, SERUM, QUALITATIVE: Preg, Serum: NEGATIVE

## 2023-06-17 LAB — LIPASE, BLOOD: Lipase: 22 U/L (ref 11–51)

## 2023-06-17 MED ORDER — IOHEXOL 350 MG/ML SOLN
75.0000 mL | Freq: Once | INTRAVENOUS | Status: AC | PRN
  Administered 2023-06-17: 75 mL via INTRAVENOUS

## 2023-06-17 MED ORDER — TAMSULOSIN HCL 0.4 MG PO CAPS
0.4000 mg | ORAL_CAPSULE | Freq: Once | ORAL | Status: AC
  Administered 2023-06-17: 0.4 mg via ORAL
  Filled 2023-06-17: qty 1

## 2023-06-17 MED ORDER — ONDANSETRON 4 MG PO TBDP: 4.0000 mg | ORAL_TABLET | Freq: Three times a day (TID) | ORAL | 0 refills | Status: DC | PRN

## 2023-06-17 MED ORDER — ONDANSETRON HCL 4 MG/2ML IJ SOLN
4.0000 mg | Freq: Once | INTRAMUSCULAR | Status: AC
  Administered 2023-06-17: 4 mg via INTRAVENOUS
  Filled 2023-06-17: qty 2

## 2023-06-17 MED ORDER — OXYCODONE-ACETAMINOPHEN 5-325 MG PO TABS
1.0000 | ORAL_TABLET | Freq: Once | ORAL | Status: AC
  Administered 2023-06-17: 1 via ORAL
  Filled 2023-06-17: qty 1

## 2023-06-17 MED ORDER — OXYCODONE-ACETAMINOPHEN 5-325 MG PO TABS: 1.0000 | ORAL_TABLET | Freq: Three times a day (TID) | ORAL | 0 refills | Status: AC | PRN

## 2023-06-17 MED ORDER — LACTATED RINGERS IV BOLUS
1000.0000 mL | Freq: Once | INTRAVENOUS | Status: AC
  Administered 2023-06-17: 1000 mL via INTRAVENOUS

## 2023-06-17 MED ORDER — MORPHINE SULFATE (PF) 4 MG/ML IV SOLN
4.0000 mg | Freq: Once | INTRAVENOUS | Status: AC
  Administered 2023-06-17: 4 mg via INTRAVENOUS
  Filled 2023-06-17: qty 1

## 2023-06-17 MED ORDER — TAMSULOSIN HCL 0.4 MG PO CAPS: 0.4000 mg | ORAL_CAPSULE | Freq: Every day | ORAL | 0 refills | Status: AC

## 2023-06-17 MED ORDER — KETOROLAC TROMETHAMINE 15 MG/ML IJ SOLN
15.0000 mg | Freq: Once | INTRAMUSCULAR | Status: AC
  Administered 2023-06-17: 15 mg via INTRAVENOUS
  Filled 2023-06-17: qty 1

## 2023-06-17 MED ORDER — LACTATED RINGERS IV BOLUS: 1000.0000 mL | Freq: Once | INTRAVENOUS | Status: DC

## 2023-06-17 NOTE — ED Notes (Addendum)
Patient transported to [facility] 

## 2023-06-17 NOTE — ED Triage Notes (Signed)
Pt c/o L sided flank pain and abd pain that started this morning. Pt also c/o frequent urination and N/V. Pt denies hx of kidney stones and DM. LMP approximately two weeks ago.

## 2023-06-17 NOTE — ED Provider Notes (Signed)
[place] EMERGENCY DEPARTMENT AT [facility] Provider Note   CSN: [id number] Arrival date & time: 06/17/23  6213     History  Chief Complaint  Patient presents with   Abdominal Pain    Margaret Sosa is a 27 y.o. female.   Abdominal Pain Associated symptoms: chills, fatigue, nausea and vomiting   Patient presents for pain in left flank and back.  Medical history includes obesity, HSV.  She endorses some mild fatigue yesterday.  She was otherwise in her normal state of health.  This morning, shortly after awakening, she went to have a bowel movement.  Around that time, she had acute onset of left flank and lower back pain.  Pain radiates to the left lower abdomen.  She has had nausea and vomiting throughout the morning.  She has had chills.  She denies any recent vaginal discharge.  Bowel movements morning was nonbloody.     Home Medications Prior to Admission medications   Medication Sig Start Date End Date Taking? Authorizing Provider  ondansetron (ZOFRAN-ODT) 4 MG disintegrating tablet Take 1 tablet (4 mg total) by mouth every 8 (eight) hours as needed for nausea or vomiting. 06/17/23  Yes Gloris Manchester, MD  oxyCODONE-acetaminophen (PERCOCET/ROXICET) 5-325 MG tablet Take 1 tablet by mouth every 8 (eight) hours as needed for up to 2 days for severe pain (pain score 7-10). 06/17/23 06/19/23 Yes Gloris Manchester, MD  tamsulosin (FLOMAX) 0.4 MG CAPS capsule Take 1 capsule (0.4 mg total) by mouth daily for 3 days. 06/17/23 06/20/23 Yes Gloris Manchester, MD  doxylamine, Sleep, (UNISOM SLEEPTABS) 25 MG tablet Take 1 tablet (25 mg total) by mouth at bedtime as needed. Patient not taking: Reported on 05/16/2022 05/02/22   Katrinka Blazing, [place], CNM  fluconazole (DIFLUCAN) 150 MG tablet Take one tablet once. Repeat in 3 days if symptoms persist. 04/14/23   Delorse Lek, FNP  metroNIDAZOLE (FLAGYL) 500 MG tablet Take 1 tablet (500 mg total) by mouth 2 (two) times daily. 04/14/23   Delorse Lek, FNP  metroNIDAZOLE (METROGEL) 1 % gel Apply topically daily. Patient not taking: Reported on 03/16/2023 05/02/22   Katrinka Blazing, [place], CNM  miconazole (MICOTIN) 2 % cream Apply 1 Application topically 2 (two) times daily. Patient not taking: Reported on 05/16/2022 04/07/22   Domenick Gong, MD  ondansetron (ZOFRAN) 4 MG tablet Take 1 tablet (4 mg total) by mouth every 6 (six) hours. 03/16/23   Gareth Eagle, PA-C  pyridOXINE (VITAMIN B6) 100 MG tablet Take 1 tablet (100 mg total) by mouth 2 (two) times daily. Patient not taking: Reported on 05/16/2022 05/02/22   Dorathy Kinsman, CNM      Allergies    Patient has no known allergies.    Review of Systems   Review of Systems  Constitutional:  Positive for chills and fatigue.  Gastrointestinal:  Positive for abdominal pain, nausea and vomiting.  Genitourinary:  Positive for flank pain.  Musculoskeletal:  Positive for back pain.  All other systems reviewed and are negative.   Physical Exam Updated Vital Signs BP (!) 145/81   Pulse 81   Temp (!) 97.4 F (36.3 C)   Resp 17   SpO2 100%  Physical Exam Vitals and nursing note reviewed.  Constitutional:      General: She is not in acute distress.    Appearance: She is well-developed. She is not ill-appearing, toxic-appearing or diaphoretic.  HENT:     Head: Normocephalic and atraumatic.     Mouth/Throat:  Mouth: Mucous membranes are moist.  Eyes:     General: No scleral icterus.    Conjunctiva/sclera: Conjunctivae normal.  Cardiovascular:     Rate and Rhythm: Normal rate and regular rhythm.  Pulmonary:     Effort: Pulmonary effort is normal. No respiratory distress.  Abdominal:     Palpations: Abdomen is soft.     Tenderness: There is abdominal tenderness in the left lower quadrant. There is left CVA tenderness. There is no right CVA tenderness, guarding or rebound.  Musculoskeletal:        General: No swelling.     Cervical back: Neck supple.  Skin:    General: Skin is  warm and dry.  Neurological:     General: No focal deficit present.     Mental Status: She is alert and oriented to person, place, and time.  Psychiatric:        Mood and Affect: Mood normal.        Behavior: Behavior normal.     ED Results / Procedures / Treatments   Labs (all labs ordered are listed, but only abnormal results are displayed) Labs Reviewed  COMPREHENSIVE METABOLIC PANEL - Abnormal; Notable for the following components:      Result Value   Sodium 134 (*)    Glucose, Bld 192 (*)    All other components within normal limits  URINALYSIS, ROUTINE W REFLEX MICROSCOPIC - Abnormal; Notable for the following components:   APPearance CLOUDY (*)    Protein, ur 30 (*)    Leukocytes,Ua TRACE (*)    Bacteria, UA RARE (*)    All other components within normal limits  LIPASE, BLOOD  CBC  HCG, SERUM, QUALITATIVE    EKG None  Radiology CT ABDOMEN PELVIS W CONTRAST Result Date: 06/17/2023 CLINICAL DATA:  Left flank and left abdominal pain since this morning with urinary frequency, nausea and vomiting EXAM: CT ABDOMEN AND PELVIS WITH CONTRAST TECHNIQUE: Multidetector CT imaging of the abdomen and pelvis was performed using the standard protocol following bolus administration of intravenous contrast. RADIATION DOSE REDUCTION: This exam was performed according to the departmental dose-optimization program which includes automated exposure control, adjustment of the mA and/or kV according to patient size and/or use of iterative reconstruction technique. CONTRAST:  75mL OMNIPAQUE IOHEXOL 350 MG/ML SOLN COMPARISON:  06/17/2023 pelvic sonogram FINDINGS: Lower chest: No significant pulmonary nodules or acute consolidative airspace disease. Hepatobiliary: Normal liver size. No liver mass. Normal gallbladder with no radiopaque cholelithiasis. No biliary ductal dilatation. Pancreas: Normal, with no mass or duct dilation. Spleen: Normal size. No mass. Adrenals/Urinary Tract: Normal adrenals.  Obstructing 2 mm left ureterovesical junction stone with mild-to-moderate left hydroureteronephrosis. Delayed left contrast nephrogram. Asymmetrically mildly enlarged left kidney. Asymmetric left perinephric edema. Nonobstructing 1 mm lower right renal stone. No right hydronephrosis. No renal masses. Normal nondistended bladder. Stomach/Bowel: Normal non-distended stomach. Normal caliber small bowel with no small bowel wall thickening. Normal appendix. Scattered mild colonic diverticulosis with no large bowel wall thickening or significant pericolonic fat stranding. Vascular/Lymphatic: Normal caliber abdominal aorta. Patent portal, splenic, hepatic and renal veins. No pathologically enlarged lymph nodes in the abdomen or pelvis. Reproductive: Grossly normal uterus.  No adnexal mass. Other: No pneumoperitoneum, ascites or focal fluid collection. Musculoskeletal: No aggressive appearing focal osseous lesions. Minimal spondylosis at T11-12. IMPRESSION: 1. Obstructing 2 mm left ureterovesical junction stone with mild-to-moderate left hydroureteronephrosis. 2. Nonobstructing 1 mm lower right renal stone. 3. Scattered mild colonic diverticulosis. Electronically Signed   By: Delbert Phenix  M.D.   On: 06/17/2023 13:30   US PELVIC TRANSABD W/PELVIC DOPPLER Result Date: 06/17/2023 CLINICAL DATA:  Left lower quadrant pain. EXAM: TRANSABDOMINAL ULTRASOUND OF PELVIS DOPPLER ULTRASOUND OF OVARIES TECHNIQUE: Transabdominal ultrasound examination of the pelvis was performed including evaluation of the uterus, ovaries, adnexal regions, and pelvic cul-de-sac. Color and duplex Doppler ultrasound was utilized to evaluate blood flow to the ovaries. COMPARISON:  None Available. FINDINGS: Uterus Measurements: 7.0 x 3.7 x 4.6 cm = volume: 60.5 mL. No fibroids or other mass visualized. Endometrium Thickness: 7.0 mm.  No focal abnormality visualized. Right ovary Measurements: 3.2 x 2.7 x 3.0 cm = volume: 13.8 mL. Normal appearance/no  adnexal mass. Left ovary Measurements: 2.8 x 1.8 x 2.6 cm = volume: 6.8 mL. Normal appearance/no adnexal mass. Pulsed Doppler evaluation demonstrates normal low-resistance arterial and venous waveforms in both ovaries. Other: None IMPRESSION: Normal pelvic ultrasound. Electronically Signed   By: Signa Kell M.D.   On: 06/17/2023 12:55    Procedures Procedures    Medications Ordered in ED Medications  lactated ringers bolus 1,000 mL (has no administration in time range)  lactated ringers bolus 1,000 mL (1,000 mLs Intravenous New Bag/Given 06/17/23 1149)  morphine (PF) 4 MG/ML injection 4 mg (4 mg Intravenous Given 06/17/23 1154)  ondansetron (ZOFRAN) injection 4 mg (4 mg Intravenous Given 06/17/23 1153)  iohexol (OMNIPAQUE) 350 MG/ML injection 75 mL (75 mLs Intravenous Contrast Given 06/17/23 1310)  ketorolac (TORADOL) 15 MG/ML injection 15 mg (15 mg Intravenous Given 06/17/23 1410)  tamsulosin (FLOMAX) capsule 0.4 mg (0.4 mg Oral Given 06/17/23 1410)  oxyCODONE-acetaminophen (PERCOCET/ROXICET) 5-325 MG per tablet 1 tablet (1 tablet Oral Given 06/17/23 1410)    ED Course/ Medical Decision Making/ A&P                                 Medical Decision Making Amount and/or Complexity of Data Reviewed Labs: ordered. Radiology: ordered.  Risk Prescription drug management.   This patient presents to the ED for concern of left leg pain, this involves an extensive number of treatment options, and is a complaint that carries with it a high risk of complications and morbidity.  The differential diagnosis includes nephrolithiasis, pyelonephritis, constipation, diverticulitis, colitis, ovarian torsion, PID   Co morbidities that complicate the patient evaluation  obesity, HSV   Additional history obtained:  Additional history obtained from N/A External records from outside source obtained and reviewed including EMR   Lab Tests:  I Ordered, and personally interpreted labs.  The  pertinent results include: Normal kidney function, normal electrolytes, no leukocytosis, no clear evidence of UTI   Imaging Studies ordered:  I ordered imaging studies including pelvic ultrasound, CT of abdomen and pelvis I independently visualized and interpreted imaging which showed no acute findings on ultrasound; CT scan showed a 2 mm stone at left UVJ with mild to moderate upstream hydro. I agree with the radiologist interpretation   Cardiac Monitoring: / EKG:  The patient was maintained on a cardiac monitor.  I personally viewed and interpreted the cardiac monitored which showed an underlying rhythm of: Sinus rhythm  Problem List / ED Course / Critical interventions / Medication management  Patient presenting for acute onset of left flank, left lower quadrant, and left lower back pain.  On arrival in the ED, vital signs are notable for hypertension.  Patient appears uncomfortable on exam.  Initial lab work shows no leukocytosis, negative pregnancy, and no  clear evidence of UTI or hematuria.  Morphine Zofran were ordered for analgesia.  Patient undergo CT and ultrasound imaging.  Ultrasound did not show acute findings.  CT scan showed 2 mm left UVJ.  Toradol was ordered for ongoing analgesia.  She does report resolution of nausea at this time.  Additional IV fluids and tamsulosin were ordered.  Patient subsequently had resolution of her pain.  She may have passed her stone.  She was still provided with pain medication and nausea medicine for home.  She was advised to drink plenty of water and follow-up with urology.  She was discharged in good condition. I ordered medication including IV fluids for hydration; morphine, Percocet, Toradol for analgesia; Zofran for nausea; tamsulosin for ureteral stone Reevaluation of the patient after these medicines showed that the patient improved I have reviewed the patients home medicines and have made adjustments as needed   Social Determinants of  Health:  Lives independently        Final Clinical Impression(s) / ED Diagnoses Final diagnoses:  Kidney stone    Rx / DC Orders ED Discharge Orders          Ordered    ondansetron (ZOFRAN-ODT) 4 MG disintegrating tablet  Every 8 hours PRN        06/17/23 1547    tamsulosin (FLOMAX) 0.4 MG CAPS capsule  Daily        06/17/23 1547    oxyCODONE-acetaminophen (PERCOCET/ROXICET) 5-325 MG tablet  Every 8 hours PRN        06/17/23 1547              Gloris Manchester, MD 06/17/23 1547

## 2023-06-17 NOTE — ED Notes (Signed)
Pt off unit to imaging.

## 2023-06-17 NOTE — Discharge Instructions (Addendum)
There were prescriptions sent to your pharmacy.  Take tamsulosin daily for the next 3 days.  This helps relax smooth muscles to facilitate passage of stone.  Take ondansetron as needed for nausea.  Drink plenty of fluids.  Take over-the-counter ibuprofen and Tylenol as needed for pain.  A prescription for narcotic pain medication was sent to your pharmacy.  Take this only as needed.  There is a telephone number below to call to set up a follow-up appointment with urology.  Return to the emergency department for any new or worsening symptoms of concern.

## 2023-07-25 ENCOUNTER — Ambulatory Visit (HOSPITAL_COMMUNITY): Payer: Medicaid Other

## 2023-09-25 ENCOUNTER — Ambulatory Visit (HOSPITAL_COMMUNITY)

## 2023-09-26 ENCOUNTER — Other Ambulatory Visit: Payer: Self-pay

## 2023-09-26 ENCOUNTER — Encounter (HOSPITAL_COMMUNITY): Payer: Self-pay

## 2023-09-26 ENCOUNTER — Ambulatory Visit (HOSPITAL_COMMUNITY)
Admission: RE | Admit: 2023-09-26 | Discharge: 2023-09-26 | Disposition: A | Discharge status: Home / Self Care | Source: Ambulatory Visit | Attending: Emergency Medicine | Admitting: Emergency Medicine

## 2023-09-26 VITALS — BP 133/90 | HR 70 | Temp 98.4°F | Resp 18

## 2023-09-26 DIAGNOSIS — N898 Other specified noninflammatory disorders of vagina: Secondary | ICD-10-CM | POA: Diagnosis present

## 2023-09-26 MED ORDER — FLUCONAZOLE 150 MG PO TABS: ORAL_TABLET | ORAL | 0 refills | Status: DC

## 2023-09-26 NOTE — ED Provider Notes (Signed)
 [facility]    CSN: [id number] Arrival date & time: 09/26/23  1531      History   Chief Complaint Chief Complaint  Patient presents with   Vaginal Itching    Yeast - Entered by patient   Appointment    3:30 pm    HPI Margaret Sosa is a 28 y.o. female.   Patient presents to clinic over concerns of vaginal itching with a increased white discharge that has been ongoing for the past week.  Patient reports recurrent history of yeast infection and this is similar.  She has not had any fishy vaginal odor.  She is sexually active without condoms, reports same sexual partner and denies concern for sexually transmitted infection.  She did change her soap recently, this may have triggered the vaginal itching.  Denies dysuria, urgency or frequency.  Denies chance for pregnancy.  The history is provided by the patient and medical records.  Vaginal Itching    Past Medical History:  Diagnosis Date   HSV-2 (herpes simplex virus 2) infection    Obesity (BMI 35.0-39.9 without comorbidity) 12/21/2016   Recurrent UTI (urinary tract infection)     Patient Active Problem List   Diagnosis Date Noted   HSV-2 infection 05/21/2022   Obesity (BMI 35.0-39.9 without comorbidity) 12/21/2016    Past Surgical History:  Procedure Laterality Date   NO PAST SURGERIES      OB History     Gravida  2   Para  0   Term  0   Preterm  0   AB  1   Living  0      SAB  1   IAB  0   Ectopic  0   Multiple  0   Live Births  0            Home Medications    Prior to Admission medications   Medication Sig Start Date End Date Taking? Authorizing Provider  fluconazole (DIFLUCAN) 150 MG tablet Take 1 tablet today and another in 72 hours if vaginal itching persists. 09/26/23  Yes Kurstin Dimarzo, [place], FNP    Family History Family History  Problem Relation Age of Onset   Hypertension Mother    Diabetes Sister 4    Social History Social History   Tobacco Use    Smoking status: Never   Smokeless tobacco: Never  Vaping Use   Vaping status: Never Used  Substance Use Topics   Alcohol use: Yes   Drug use: Yes    Types: Marijuana     Allergies   Patient has no known allergies.   Review of Systems Review of Systems  Per HPI  Physical Exam Triage Vital Signs ED Triage Vitals  Encounter Vitals Group     BP 09/26/23 1616 (!) 133/90     Systolic BP Percentile --      Diastolic BP Percentile --      Pulse Rate 09/26/23 1616 70     Resp 09/26/23 1616 18     Temp 09/26/23 1616 98.4 F (36.9 C)     Temp Source 09/26/23 1616 Oral     SpO2 09/26/23 1616 98 %     Weight --      Height --      Head Circumference --      Peak Flow --      Pain Score 09/26/23 1613 2     Pain Loc --      Pain Education --  Exclude from Growth Chart --    No data found.  Updated Vital Signs BP (!) 133/90 (BP Location: Left Arm) Comment (BP Location): large cuff  Pulse 70   Temp 98.4 F (36.9 C) (Oral)   Resp 18   LMP 09/14/2023 (Approximate)   SpO2 98%   Visual Acuity Right Eye Distance:   Left Eye Distance:   Bilateral Distance:    Right Eye Near:   Left Eye Near:    Bilateral Near:     Physical Exam Vitals and nursing note reviewed.  Constitutional:      Appearance: Normal appearance.  HENT:     Head: Normocephalic and atraumatic.     Right Ear: External ear normal.     Left Ear: External ear normal.     Nose: Nose normal.     Mouth/Throat:     Mouth: Mucous membranes are moist.  Eyes:     Conjunctiva/sclera: Conjunctivae normal.  Cardiovascular:     Rate and Rhythm: Normal rate.  Pulmonary:     Effort: Pulmonary effort is normal. No respiratory distress.  Neurological:     General: No focal deficit present.     Mental Status: She is alert.  Psychiatric:        Mood and Affect: Mood normal.      UC Treatments / Results  Labs (all labs ordered are listed, but only abnormal results are displayed) Labs Reviewed   CERVICOVAGINAL ANCILLARY ONLY    EKG   Radiology No results found.  Procedures Procedures (including critical care time)  Medications Ordered in UC Medications - No data to display  Initial Impression / Assessment and Plan / UC Course  I have reviewed the triage vital signs and the nursing notes.  Pertinent labs & imaging results that were available during my care of the patient were reviewed by me and considered in my medical decision making (see chart for details).  Vitals and triage reviewed, patient is hemodynamically stable.  Vaginal itching consistent with yeast vaginitis, will treat empirically with Diflucan.  Cytology swab obtained.  UA deferred due to lack of urinary symptoms.  Plan of care, follow-up care return precautions given, no questions at this time.     Final Clinical Impressions(s) / UC Diagnoses   Final diagnoses:  Vaginal itching     Discharge Instructions      Take the Diflucan today and then in another 72 hours or 3 days if the vaginal itching persist.  We have sent your swab off and we will contact you if additional treatment is needed.  Abstain from intercourse until results have been received.  I suggest using a hypoallergenic soap such as Dove sensitive skin and cleaning externally.  Return to clinic for any new or urgent symptoms.    ED Prescriptions     Medication Sig Dispense Auth. Provider   fluconazole (DIFLUCAN) 150 MG tablet Take 1 tablet today and another in 72 hours if vaginal itching persists. 2 tablet Kenyia [place], [place], FNP      PDMP not reviewed this encounter.   Gr[place]en Woodyard, [place], [place] 09/26/23 [phone]

## 2023-09-26 NOTE — ED Triage Notes (Signed)
 Vaginal itching for a week.  Patient reports vaginal discharge.  Patient reports she is proned to yeast infections.  Did not use any OTC remedies.  Patient thinks changing her soap recently is what caused this issue

## 2023-09-26 NOTE — Discharge Instructions (Addendum)
 Take the Diflucan today and then in another 72 hours or 3 days if the vaginal itching persist.  We have sent your swab off and we will contact you if additional treatment is needed.  Abstain from intercourse until results have been received.  I suggest using a hypoallergenic soap such as Dove sensitive skin and cleaning externally.  Return to clinic for any new or urgent symptoms.

## 2023-09-27 LAB — CERVICOVAGINAL ANCILLARY ONLY
Bacterial Vaginitis (gardnerella): POSITIVE — AB
Candida Glabrata: NEGATIVE
Candida Vaginitis: POSITIVE — AB
Chlamydia: NEGATIVE
Comment: NEGATIVE
Comment: NEGATIVE
Comment: NEGATIVE
Comment: NEGATIVE
Comment: NEGATIVE
Comment: NORMAL
Neisseria Gonorrhea: NEGATIVE
Trichomonas: NEGATIVE

## 2023-09-28 ENCOUNTER — Telehealth (HOSPITAL_BASED_OUTPATIENT_CLINIC_OR_DEPARTMENT_OTHER): Payer: Self-pay

## 2023-09-28 MED ORDER — METRONIDAZOLE 0.75 % VA GEL: 1.0000 | Freq: Every day | VAGINAL | 0 refills | Status: AC

## 2023-09-28 NOTE — Telephone Encounter (Signed)
 Per protocol, pt requires tx with metronidazole. Rx sent to pharmacy on file.

## 2024-01-11 ENCOUNTER — Ambulatory Visit: Payer: No Typology Code available for payment source | Admitting: Podiatry

## 2024-01-14 ENCOUNTER — Telehealth: Admitting: Family Medicine

## 2024-01-14 DIAGNOSIS — N760 Acute vaginitis: Secondary | ICD-10-CM

## 2024-01-14 DIAGNOSIS — B9689 Other specified bacterial agents as the cause of diseases classified elsewhere: Secondary | ICD-10-CM | POA: Diagnosis not present

## 2024-01-14 DIAGNOSIS — B3731 Acute candidiasis of vulva and vagina: Secondary | ICD-10-CM

## 2024-01-14 MED ORDER — METRONIDAZOLE 500 MG PO TABS: 500.0000 mg | ORAL_TABLET | Freq: Two times a day (BID) | ORAL | 0 refills | Status: AC

## 2024-01-14 MED ORDER — FLUCONAZOLE 150 MG PO TABS: 150.0000 mg | ORAL_TABLET | ORAL | 0 refills | Status: DC

## 2024-01-14 NOTE — Progress Notes (Signed)
 Virtual Visit Consent   Kabria T Rawlinson, you are scheduled for a virtual visit with a [facility] provider today. Just as with appointments in the office, your consent must be obtained to participate. Your consent will be active for this visit and any virtual visit you may have with one of our providers in the next 365 days. If you have a MyChart account, a copy of this consent can be sent to you electronically.  As this is a virtual visit, video technology does not allow for your provider to perform a traditional examination. This may limit your provider's ability to fully assess your condition. If your provider identifies any concerns that need to be evaluated in person or the need to arrange testing (such as labs, EKG, etc.), we will make arrangements to do so. Although advances in technology are sophisticated, we cannot ensure that it will always work on either your end or our end. If the connection with a video visit is poor, the visit may have to be switched to a telephone visit. With either a video or telephone visit, we are not always able to ensure that we have a secure connection.  By engaging in this virtual visit, you consent to the provision of healthcare and authorize for your insurance to be billed (if applicable) for the services provided during this visit. Depending on your insurance coverage, you may receive a charge related to this service.  I need to obtain your verbal consent now. Are you willing to proceed with your visit today? Tomeshia T Hase has provided verbal consent on 01/14/2024 for a virtual visit (video or telephone). [name], NP  Date: 01/14/2024 1:30 PM   Virtual Visit via Video Note   I, [name], connected with  Emmalise DENAJAH FARIAS  ([id number], 26-Oct-1995) on 01/14/24 at  1:30 PM EDT by a video-enabled telemedicine application and verified that I am speaking with the correct person using two identifiers.  Location: Patient: Virtual Visit Location  Patient: Home Provider: Virtual Visit Location Provider: Home Office   I discussed the limitations of evaluation and management by telemedicine and the availability of in person appointments. The patient expressed understanding and agreed to proceed.    History of Present Illness: Margaret Sosa is a 28 y.o. who identifies as a female who was assigned female at birth, and is being seen today for vaginal discharge  Odor with vaginal discharge known yeast and BV in past. Reports today she just ended her cycle but now is having the odor and vaginal discharge again, with itching.  Denies chance of pregnancy. Denies UTI symptoms. Denies use of new sexual partners, STI exposure. Denies fevers, chills, pelvic pain.   Problems:  Patient Active Problem List   Diagnosis Date Noted   HSV-2 infection 05/21/2022   Obesity (BMI 35.0-39.9 without comorbidity) 12/21/2016    Allergies: No Known Allergies Medications:  Current Outpatient Medications:    fluconazole  (DIFLUCAN ) 150 MG tablet, Take 1 tablet today and another in 72 hours if vaginal itching persists., Disp: 2 tablet, Rfl: 0  Observations/Objective: Patient is well-developed, well-nourished in no acute distress.  Resting comfortably at home.  Head is normocephalic, atraumatic.  No labored breathing.  Speech is clear and coherent with logical content.  Patient is alert and oriented at baseline.    Assessment and Plan:    1. Bacterial vaginitis (Primary)  - metroNIDAZOLE  (FLAGYL ) 500 MG tablet; Take 1 tablet (500 mg total) by mouth 2 (two) times daily for  7 days.  Dispense: 14 tablet; Refill: 0  2. Candidiasis of vagina  - fluconazole  (DIFLUCAN ) 150 MG tablet; Take 1 tablet (150 mg total) by mouth as directed. Repeat in 3 days as needed  Dispense: 2 tablet; Refill: 0   -avoid scent soaps and lotions  -wash with mild soap and water -avoid douches and vaginal sprays and wipes -pick up boric acid suppositories and use every  other day of the week (like Monday, Wednesday, and Friday) to help balance your pH levels -change under garments daily -wear cotton and or breathable underwear to bed  Reviewed side effects, risks and benefits of medication.    Patient acknowledged agreement and understanding of the plan.   Past Medical, Surgical, Social History, Allergies, and Medications have been Reviewed.   Follow Up Instructions: I discussed the assessment and treatment plan with the patient. The patient was provided an opportunity to ask questions and all were answered. The patient agreed with the plan and demonstrated an understanding of the instructions.  A copy of instructions were sent to the patient via MyChart unless otherwise noted below.    The patient was advised to call back or seek an in-person evaluation if the symptoms worsen or if the condition fails to improve as anticipated.    [name], NP

## 2024-01-14 NOTE — Patient Instructions (Signed)
  [name], thank you for joining [name], NP for today's virtual visit.  While this provider is not your primary care provider (PCP), if your PCP is located in our provider database this encounter information will be shared with them immediately following your visit.   A [facility] MyChart account gives you access to today's visit and all your visits, tests, and labs performed at [facility]  click here if you don't have a [facility] MyChart account or go to [email or web address]  Consent: (Patient) Margaret Sosa provided verbal consent for this virtual visit at the beginning of the encounter.  Current Medications:  Current Outpatient Medications:    fluconazole  (DIFLUCAN ) 150 MG tablet, Take 1 tablet (150 mg total) by mouth as directed. Repeat in 3 days as needed, Disp: 2 tablet, Rfl: 0   metroNIDAZOLE  (FLAGYL ) 500 MG tablet, Take 1 tablet (500 mg total) by mouth 2 (two) times daily for 7 days., Disp: 14 tablet, Rfl: 0   Medications ordered in this encounter:  Meds ordered this encounter  Medications   metroNIDAZOLE  (FLAGYL ) 500 MG tablet    Sig: Take 1 tablet (500 mg total) by mouth 2 (two) times daily for 7 days.    Dispense:  14 tablet    Refill:  0    Supervising Provider:   BLAISE ALEENE KIDD [[id number]]   fluconazole  (DIFLUCAN ) 150 MG tablet    Sig: Take 1 tablet (150 mg total) by mouth as directed. Repeat in 3 days as needed    Dispense:  2 tablet    Refill:  0    Supervising Provider:   LAMPTEY, PHILIP O [[id number]]     *If you need refills on other medications prior to your next appointment, please contact your pharmacy*  Follow-Up: Call back or seek an in-person evaluation if the symptoms worsen or if the condition fails to improve as anticipated.  [facility] [phone]  Other Instructions  -avoid scent soaps and lotions  -wash with mild soap and water -avoid douches and vaginal sprays and wipes -pick up boric acid  suppositories and use every other day of the week (like Monday, Wednesday, and Friday) to help balance your pH levels -change under garments daily -wear cotton and or breathable underwear to bed    If you have been instructed to have an in-person evaluation today at a local Urgent Care facility, please use the link below. It will take you to a list of all of our available [facility], including address, phone number and hours of operation. Please do not delay care.  [facility]  If you or a family member do not have a primary care provider, use the link below to schedule a visit and establish care. When you choose a [facility] primary care physician or advanced practice provider, you gain a long-term partner in health. Find a Primary Care Provider  Learn more about [facility]'s in-office and virtual care options: [place] - Get Care Now

## 2024-01-19 ENCOUNTER — Encounter: Payer: Self-pay | Admitting: Family Medicine

## 2024-02-27 ENCOUNTER — Telehealth: Admitting: Physician Assistant

## 2024-02-27 DIAGNOSIS — R519 Headache, unspecified: Secondary | ICD-10-CM

## 2024-02-27 DIAGNOSIS — G514 Facial myokymia: Secondary | ICD-10-CM

## 2024-02-27 NOTE — Progress Notes (Signed)

## 2024-05-09 ENCOUNTER — Telehealth: Admitting: Physician Assistant

## 2024-05-09 DIAGNOSIS — B3731 Acute candidiasis of vulva and vagina: Secondary | ICD-10-CM

## 2024-05-09 MED ORDER — FLUCONAZOLE 150 MG PO TABS: 150.0000 mg | ORAL_TABLET | ORAL | 0 refills | Status: DC | PRN

## 2024-05-09 NOTE — Progress Notes (Signed)

## 2024-05-15 ENCOUNTER — Other Ambulatory Visit: Payer: Self-pay

## 2024-05-15 ENCOUNTER — Encounter (HOSPITAL_COMMUNITY): Payer: Self-pay

## 2024-05-15 ENCOUNTER — Ambulatory Visit (HOSPITAL_COMMUNITY)
Admission: RE | Admit: 2024-05-15 | Discharge: 2024-05-15 | Disposition: A | Discharge status: Home / Self Care | Source: Ambulatory Visit | Attending: Family Medicine | Admitting: Family Medicine

## 2024-05-15 VITALS — BP 141/84 | HR 75 | Temp 99.1°F | Resp 16

## 2024-05-15 DIAGNOSIS — R220 Localized swelling, mass and lump, head: Secondary | ICD-10-CM

## 2024-05-15 MED ORDER — HYDROCODONE-ACETAMINOPHEN 5-325 MG PO TABS: 1.0000 | ORAL_TABLET | Freq: Four times a day (QID) | ORAL | 0 refills | Status: DC | PRN

## 2024-05-15 MED ORDER — DOXYCYCLINE HYCLATE 100 MG PO CAPS: 100.0000 mg | ORAL_CAPSULE | Freq: Two times a day (BID) | ORAL | 0 refills | Status: DC

## 2024-05-15 NOTE — ED Provider Notes (Signed)
 [facility]   [id number] 05/15/24 Arrival Time: 1731  ASSESSMENT & PLAN:  1. Left facial swelling    Unclear etiology. No sign of specific abscess formation. Will cover for infection and observe over next 24-48 hours. Without fever.  Meds ordered this encounter  Medications   doxycycline (VIBRAMYCIN) 100 MG capsule    Sig: Take 1 capsule (100 mg total) by mouth 2 (two) times daily.    Dispense:  14 capsule    Refill:  0   HYDROcodone -acetaminophen  (NORCO/VICODIN) 5-325 MG tablet    Sig: Take 1 tablet by mouth every 6 (six) hours as needed for moderate pain (pain score 4-6) or severe pain (pain score 7-10).    Dispense:  8 tablet    Refill:  0     Follow-up Information     [facility] at [facility].   Specialty: Urgent Care Why: If worsening or failing to improve as anticipated. Contact information: [address] [place]  [address] [phone]        Go to  [facility] at [facility].   Specialty: Emergency Medicine Why: If you face starts to swell rapidly with or without a fever. Contact information: [address] [place]  [phone]               [place] Controlled Substances Registry consulted for this patient. I feel the risk/benefit ratio today is favorable for proceeding with this prescription for a controlled substance. Medication sedation precautions given.  Reviewed expectations re: course of current medical issues. Questions answered. Outlined signs and symptoms indicating need for more acute intervention. Understanding verbalized. After Visit Summary given.   SUBJECTIVE: History from: Patient. Margaret Sosa is a 28 y.o. female. Reports swelling/pain/tenderness of L face just anterior to ear; x 2 days. More painful today. Denies fever. Opening/closing jaw exacerbates pain. Denies toothache. Pain has affected sleep. No tx  PTA.  OBJECTIVE:  Vitals:   05/15/24 1804  BP: (!) 141/84  Pulse: 75  Resp: 16  Temp: 99.1 F (37.3 C)  TempSrc: Oral  SpO2: 99%    General appearance: alert; no distress Eyes: PERRLA; EOMI; conjunctiva normal HENT: [place]; AT; without nasal congestion; slight swelling and TTP just anterior to R ear; without overlying erythema; normal jaw movements Neck: supple without cervical LAD Lungs: speaks full sentences without difficulty; unlabored Extremities: no edema Skin: warm and dry Psychological: alert and cooperative; normal mood and affect  Labs:  Labs Reviewed - No data to display  Imaging: No results found.  No Known Allergies  Past Medical History:  Diagnosis Date   HSV-2 (herpes simplex virus 2) infection    Obesity (BMI 35.0-39.9 without comorbidity) 12/21/2016   Recurrent UTI (urinary tract infection)    Social History   Socioeconomic History   Marital status: Single    Spouse name: Not on file   Number of children: Not on file   Years of education: Not on file   Highest education level: Not on file  Occupational History   Not on file  Tobacco Use   Smoking status: Never   Smokeless tobacco: Never  Vaping Use   Vaping status: Never Used  Substance and Sexual Activity   Alcohol use: Yes    Comment: occasionally   Drug use: Not Currently    Types: Marijuana   Sexual activity: Yes    Birth control/protection: None  Other Topics Concern   Not on file  Social  History Narrative   Not on file   Social Drivers of Health   Financial Resource Strain: Not on file  Food Insecurity: Not on file  Transportation Needs: Not on file  Physical Activity: Not on file  Stress: Not on file  Social Connections: Unknown (08/06/2022)   Received from [facility]   Social Network    Social Network: Not on file  Intimate Partner Violence: Not At Risk (03/05/2024)   Received from [facility]   HITS    Over the last 12 months how often did your partner physically  hurt you?: Never    Over the last 12 months how often did your partner insult you or talk down to you?: Never    Over the last 12 months how often did your partner threaten you with physical harm?: Never    Over the last 12 months how often did your partner scream or curse at you?: Never   Family History  Problem Relation Age of Onset   Hypertension Mother    Diabetes Sister 4   Past Surgical History:  Procedure Laterality Date   NO PAST SURGERIES       Rolinda Rogue, MD 05/15/24 [phone]

## 2024-05-15 NOTE — Discharge Instructions (Signed)
 Be aware, you have been prescribed pain medications that may cause drowsiness. While taking this medication, do not take any other medications containing acetaminophen (Tylenol). Do not combine with alcohol or recreational drugs. Please do not drive, operate heavy machinery, or take part in activities that require making important decisions while on this medication as your judgement may be clouded.

## 2024-05-15 NOTE — ED Triage Notes (Signed)
 States woke up 2 days ago with soreness in front of left ear. Over past couple days, has progressed, now causing swelling, and she noticed a knot to area. States pain now radiating into left jaw and neck. Denies fevers or recent illnesses.

## 2024-06-11 ENCOUNTER — Encounter: Payer: Self-pay | Admitting: Family Medicine

## 2024-06-11 ENCOUNTER — Telehealth

## 2024-06-11 DIAGNOSIS — B3731 Acute candidiasis of vulva and vagina: Secondary | ICD-10-CM | POA: Diagnosis not present

## 2024-06-17 MED ORDER — FLUCONAZOLE 150 MG PO TABS: 150.0000 mg | ORAL_TABLET | ORAL | 0 refills | Status: DC | PRN

## 2024-06-17 NOTE — Progress Notes (Signed)

## 2024-07-24 ENCOUNTER — Telehealth: Admitting: Physician Assistant

## 2024-07-24 DIAGNOSIS — B3731 Acute candidiasis of vulva and vagina: Secondary | ICD-10-CM

## 2024-07-24 MED ORDER — FLUCONAZOLE 150 MG PO TABS: 150.0000 mg | ORAL_TABLET | Freq: Once | ORAL | 0 refills | Status: AC

## 2024-07-24 NOTE — Progress Notes (Signed)

## 2025-03-04 ENCOUNTER — Ambulatory Visit: Admitting: Physician Assistant
